# Patient Record
Sex: Male | Born: 1955
Health system: Southern US, Community
[De-identification: ages and names within clinical notes are randomized; demographics above are authoritative.]

## PROBLEM LIST (undated history)

## (undated) DIAGNOSIS — Z8739 Personal history of other diseases of the musculoskeletal system and connective tissue: Secondary | ICD-10-CM

## (undated) DIAGNOSIS — I10 Essential (primary) hypertension: Secondary | ICD-10-CM

## (undated) DIAGNOSIS — D649 Anemia, unspecified: Secondary | ICD-10-CM

## (undated) DIAGNOSIS — H9190 Unspecified hearing loss, unspecified ear: Secondary | ICD-10-CM

## (undated) DIAGNOSIS — F4310 Post-traumatic stress disorder, unspecified: Secondary | ICD-10-CM

## (undated) DIAGNOSIS — F0390 Unspecified dementia without behavioral disturbance: Secondary | ICD-10-CM

## (undated) DIAGNOSIS — R569 Unspecified convulsions: Secondary | ICD-10-CM

## (undated) HISTORY — PX: JOINT REPLACEMENT: SHX530

## (undated) HISTORY — PX: HYDROCELE EXCISION: SHX482

---

## 1998-12-19 ENCOUNTER — Emergency Department (HOSPITAL_COMMUNITY): Admission: EM | Admit: 1998-12-19 | Discharge: 1998-12-19 | Payer: Self-pay | Admitting: Emergency Medicine

## 2013-11-23 HISTORY — PX: TOTAL KNEE ARTHROPLASTY: SHX125

## 2015-07-24 HISTORY — PX: REVISION TOTAL KNEE ARTHROPLASTY: SHX767

## 2015-11-29 ENCOUNTER — Encounter (HOSPITAL_COMMUNITY): Payer: Self-pay | Admitting: Emergency Medicine

## 2015-11-29 ENCOUNTER — Observation Stay (HOSPITAL_COMMUNITY)
Admission: EM | Admit: 2015-11-29 | Discharge: 2015-11-30 | Disposition: A | Discharge status: Home / Self Care | Payer: Non-veteran care | Attending: Internal Medicine | Admitting: Internal Medicine

## 2015-11-29 ENCOUNTER — Emergency Department (HOSPITAL_COMMUNITY): Payer: Non-veteran care

## 2015-11-29 ENCOUNTER — Observation Stay (HOSPITAL_COMMUNITY): Payer: Non-veteran care

## 2015-11-29 DIAGNOSIS — R9431 Abnormal electrocardiogram [ECG] [EKG]: Secondary | ICD-10-CM

## 2015-11-29 DIAGNOSIS — R569 Unspecified convulsions: Principal | ICD-10-CM

## 2015-11-29 DIAGNOSIS — I10 Essential (primary) hypertension: Secondary | ICD-10-CM | POA: Diagnosis not present

## 2015-11-29 DIAGNOSIS — R079 Chest pain, unspecified: Secondary | ICD-10-CM | POA: Diagnosis present

## 2015-11-29 DIAGNOSIS — R778 Other specified abnormalities of plasma proteins: Secondary | ICD-10-CM

## 2015-11-29 DIAGNOSIS — I672 Cerebral atherosclerosis: Secondary | ICD-10-CM | POA: Insufficient documentation

## 2015-11-29 DIAGNOSIS — S0081XA Abrasion of other part of head, initial encounter: Secondary | ICD-10-CM | POA: Insufficient documentation

## 2015-11-29 DIAGNOSIS — R739 Hyperglycemia, unspecified: Secondary | ICD-10-CM | POA: Diagnosis not present

## 2015-11-29 DIAGNOSIS — R7989 Other specified abnormal findings of blood chemistry: Secondary | ICD-10-CM | POA: Diagnosis present

## 2015-11-29 HISTORY — DX: Unspecified convulsions: R56.9

## 2015-11-29 HISTORY — DX: Essential (primary) hypertension: I10

## 2015-11-29 HISTORY — DX: Anemia, unspecified: D64.9

## 2015-11-29 HISTORY — DX: Personal history of other diseases of the musculoskeletal system and connective tissue: Z87.39

## 2015-11-29 HISTORY — DX: Unspecified hearing loss, unspecified ear: H91.90

## 2015-11-29 HISTORY — DX: Post-traumatic stress disorder, unspecified: F43.10

## 2015-11-29 LAB — CBC
HCT: 39.7 % (ref 39.0–52.0)
HEMATOCRIT: 37.3 % — AB (ref 39.0–52.0)
HEMOGLOBIN: 13.3 g/dL (ref 13.0–17.0)
HEMOGLOBIN: 12.4 g/dL — AB (ref 13.0–17.0)
MCH: 30.6 pg (ref 26.0–34.0)
MCH: 30.6 pg (ref 26.0–34.0)
MCHC: 33.5 g/dL (ref 30.0–36.0)
MCHC: 33.2 g/dL (ref 30.0–36.0)
MCV: 91.5 fL (ref 78.0–100.0)
MCV: 92.1 fL (ref 78.0–100.0)
PLATELETS: 201 10*3/uL (ref 150–400)
Platelets: 219 10*3/uL (ref 150–400)
RBC: 4.34 MIL/uL (ref 4.22–5.81)
RBC: 4.05 MIL/uL — AB (ref 4.22–5.81)
RDW: 14 % (ref 11.5–15.5)
RDW: 14 % (ref 11.5–15.5)
WBC: 8.7 10*3/uL (ref 4.0–10.5)
WBC: 5.8 10*3/uL (ref 4.0–10.5)

## 2015-11-29 LAB — COMPREHENSIVE METABOLIC PANEL
ALBUMIN: 4.7 g/dL (ref 3.5–5.0)
ALK PHOS: 78 U/L (ref 38–126)
ALT: 25 U/L (ref 17–63)
ANION GAP: 12 (ref 5–15)
AST: 44 U/L — AB (ref 15–41)
BUN: 9 mg/dL (ref 6–20)
CALCIUM: 9.7 mg/dL (ref 8.9–10.3)
CO2: 26 mmol/L (ref 22–32)
Chloride: 99 mmol/L — ABNORMAL LOW (ref 101–111)
Creatinine, Ser: 0.91 mg/dL (ref 0.61–1.24)
GFR calc Af Amer: >60 mL/min (ref >60)
GFR calc non Af Amer: >60 mL/min (ref >60)
GLUCOSE: 137 mg/dL — AB (ref 65–99)
Potassium: 3.9 mmol/L (ref 3.5–5.1)
SODIUM: 137 mmol/L (ref 135–145)
Total Bilirubin: 1.6 mg/dL — ABNORMAL HIGH (ref 0.3–1.2)
Total Protein: 7.7 g/dL (ref 6.5–8.1)

## 2015-11-29 LAB — RAPID URINE DRUG SCREEN, HOSP PERFORMED
AMPHETAMINES: NOT DETECTED
Barbiturates: NOT DETECTED
Benzodiazepines: POSITIVE — AB
Cocaine: NOT DETECTED
Opiates: NOT DETECTED
TETRAHYDROCANNABINOL: NOT DETECTED

## 2015-11-29 LAB — CREATININE, SERUM
Creatinine, Ser: 1.01 mg/dL (ref 0.61–1.24)
GFR calc non Af Amer: >60 mL/min (ref >60)

## 2015-11-29 LAB — TROPONIN I
TROPONIN I: 0.25 ng/mL — AB (ref <0.03)
TROPONIN I: 0.4 ng/mL — AB (ref <0.03)
Troponin I: 0.35 ng/mL (ref <0.03)

## 2015-11-29 LAB — ETHANOL

## 2015-11-29 MED ORDER — MORPHINE SULFATE (PF) 2 MG/ML IV SOLN: 2.0000 mg | INTRAVENOUS | Status: DC | PRN

## 2015-11-29 MED ORDER — MORPHINE SULFATE (PF) 4 MG/ML IV SOLN
4.0000 mg | Freq: Once | INTRAVENOUS | Status: AC
  Administered 2015-11-29: 4 mg via INTRAVENOUS
  Filled 2015-11-29: qty 1

## 2015-11-29 MED ORDER — ONDANSETRON HCL 4 MG/2ML IJ SOLN: 4.0000 mg | Freq: Four times a day (QID) | INTRAMUSCULAR | Status: DC | PRN

## 2015-11-29 MED ORDER — GI COCKTAIL ~~LOC~~: 30.0000 mL | Freq: Four times a day (QID) | ORAL | Status: DC | PRN

## 2015-11-29 MED ORDER — LORAZEPAM 2 MG/ML IJ SOLN: 2.0000 mg | INTRAMUSCULAR | Status: DC | PRN

## 2015-11-29 MED ORDER — IBUPROFEN 400 MG PO TABS
400.0000 mg | ORAL_TABLET | Freq: Once | ORAL | Status: AC
  Administered 2015-11-29: 400 mg via ORAL
  Filled 2015-11-29: qty 1

## 2015-11-29 MED ORDER — ENOXAPARIN SODIUM 40 MG/0.4ML ~~LOC~~ SOLN
40.0000 mg | SUBCUTANEOUS | Status: DC
  Administered 2015-11-29: 40 mg via SUBCUTANEOUS
  Filled 2015-11-29: qty 0.4

## 2015-11-29 MED ORDER — FERROUS SULFATE 325 (65 FE) MG PO TABS
325.0000 mg | ORAL_TABLET | Freq: Every day | ORAL | Status: DC
  Administered 2015-11-30: 325 mg via ORAL
  Filled 2015-11-29: qty 1

## 2015-11-29 MED ORDER — ACETAMINOPHEN 325 MG PO TABS: 650.0000 mg | ORAL_TABLET | ORAL | Status: DC | PRN

## 2015-11-29 MED ORDER — ASPIRIN EC 325 MG PO TBEC
325.0000 mg | DELAYED_RELEASE_TABLET | Freq: Every day | ORAL | Status: DC
  Administered 2015-11-29 – 2015-11-30 (×2): 325 mg via ORAL
  Filled 2015-11-29 (×2): qty 1

## 2015-11-29 MED ORDER — HYDROCHLOROTHIAZIDE 25 MG PO TABS
12.5000 mg | ORAL_TABLET | Freq: Every day | ORAL | Status: DC
  Administered 2015-11-29 – 2015-11-30 (×2): 12.5 mg via ORAL
  Filled 2015-11-29 (×2): qty 1

## 2015-11-29 MED ORDER — NAPROXEN 375 MG PO TABS
375.0000 mg | ORAL_TABLET | Freq: Two times a day (BID) | ORAL | Status: DC | PRN
  Filled 2015-11-29: qty 1

## 2015-11-29 NOTE — ED Notes (Signed)
Pt given sprite still wants to speak to dr

## 2015-11-29 NOTE — ED Notes (Signed)
Wife in with pt and I  Explained that most of labs looked good pt requested dr Beverlee Nimssteinal to recheck on him

## 2015-11-29 NOTE — ED Notes (Signed)
Patient transported to CT 
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
## 2015-11-29 NOTE — ED Notes (Signed)
XR at bedside

## 2015-11-29 NOTE — ED Provider Notes (Signed)
5:30 PM Pt with no ongoing symtoms to suggest ACS at this time. He does have some mild left sided chest tenderness which seems more traumatic in nature. No thoracic or lumbar tenderness. No c spine tendernes.  Chest x-ray without widened mediastinum or other acute traumatic abnormalities.  Full range of motion bilateral hips.  Abdominal exam is benign.  Symptoms today sound like a seizure for which she will need ongoing workup as an outpatient.  He did have 10 minutes of left-sided chest discomfort which is now resolved and has a slightly abnormal EKG and a troponin of 0.25.  I discussed his case with Dr. Herbie BaltimoreHarding of cardiology who reviewed both EKGs and I reviewed the clinical history and my examination of the patient.  At this time he believes that no anticoagulation should be started.  He thinks troponin should be cycled.  He agrees EKG is abnormal but very nonspecific and not specific for 1 coronary vessel in particular.  The patient does have a family history of cardiac disease including several family members with cardiac stents.  Patient be admitted to the internal medicine/hospitalist service for echocardiogram, serial troponins, repeat evaluations.  At this time cardiology does not believe that they need formal consultation unless there is an abnormality on his echocardiogram or he has rising troponins, or symptoms that begin to represent typical anginal symptoms.  There recommendations are as above   Azalia BilisKevin Yerlin Gasparyan, MD 11/29/15 1733

## 2015-11-29 NOTE — ED Notes (Signed)
MD at bedside. 

## 2015-11-29 NOTE — ED Notes (Signed)
Pt informed of the need for urine sample. Pt has urinal at bedside.
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
## 2015-11-29 NOTE — ED Notes (Signed)
Wife did states they had 3 beers last night and that sz  A few years ago was from etoh
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
## 2015-11-29 NOTE — ED Notes (Signed)
Dr. Campos aware of pts troponin.  

## 2015-11-29 NOTE — ED Notes (Signed)
Attempted to call report. Floor RN unable to accept report.  

## 2015-11-30 ENCOUNTER — Observation Stay (HOSPITAL_COMMUNITY): Payer: Non-veteran care

## 2015-11-30 DIAGNOSIS — R739 Hyperglycemia, unspecified: Secondary | ICD-10-CM | POA: Diagnosis not present

## 2015-11-30 DIAGNOSIS — R079 Chest pain, unspecified: Secondary | ICD-10-CM | POA: Diagnosis not present

## 2015-11-30 DIAGNOSIS — R569 Unspecified convulsions: Secondary | ICD-10-CM

## 2015-11-30 DIAGNOSIS — I10 Essential (primary) hypertension: Secondary | ICD-10-CM | POA: Diagnosis not present

## 2015-11-30 LAB — ECHOCARDIOGRAM COMPLETE
Height: 67 in
Weight: 2561.6 oz

## 2015-11-30 LAB — TROPONIN I: Troponin I: 0.24 ng/mL (ref <0.03)

## 2015-11-30 MED ORDER — ASPIRIN EC 81 MG PO TBEC: 81.0000 mg | DELAYED_RELEASE_TABLET | Freq: Every day | ORAL | 0 refills | Status: AC

## 2015-11-30 NOTE — Care Management Note (Signed)
Case Management Note  Patient Details  Name: Matthew Cross MRN: [id number] Date of Birth: 02/17/1956  Subjective/Objective:   Seizures                 Action/Plan: Discharge Planning: AVS reviewed:  NCM spoke to pt and states [facility], pt states [place]. [place]. Has RW, cane, and bedside commode at home. Wife at home to assist with care. [facility]   PCP- Dr Debbe [place]alesBrook [place], [facility]   Expected Discharge Date:  11/30/2015             Expected Discharge Plan:  Home/Self Care  In-House Referral:  NA  Discharge planning Services  CM Consult  Post Acute Care Choice:  NA Choice offered to:  NA  DME Arranged:  N/A DME Agency:  NA  HH Arranged:  NA HH Agency:  NA  Status of Service:  Completed, signed off  If discussed at Long Length of Stay Meetings, dates discussed:    Additional Comments:  Elliot CousinShavis, Lauri Till Ellen, RN 11/30/2015, 5:58 PM

## 2015-11-30 NOTE — Progress Notes (Signed)
EEG completed, results pending. 

## 2015-11-30 NOTE — Discharge Instructions (Signed)
Do not drive, operate heavy machinery, perform activities at heights, swimming or participation in water activities or provide baby sitting services if your were admitted for syncope or siezures until you have seen a Neurologist and advised to do so again.  [facility] in 7 days   Get CBC, CMP, 2 view Chest X ray checked  by Primary MD or SNF MD in 5-7 days ( we routinely change or add medications that can affect your baseline labs and fluid status, therefore we recommend that you get the mentioned basic workup next visit with your PCP, your PCP may decide not to get them or add new tests based on their clinical decision)   Activity: As tolerated with Full fall precautions use walker/cane & assistance as needed   Disposition Home     Diet:   Heart Healthy  .  For Heart failure patients - Check your Weight same time everyday, if you gain over 2 pounds, or you develop in leg swelling, experience more shortness of breath or chest pain, call your Primary MD immediately. Follow Cardiac Low Salt Diet and 1.5 lit/day fluid restriction.   On your next visit with your primary care physician please Get Medicines reviewed and adjusted.   Please request your Prim.MD to go over all Hospital Tests and Procedure/Radiological results at the follow up, please get all Hospital records sent to your Prim MD by signing hospital release before you go home.   If you experience worsening of your admission symptoms, develop shortness of breath, life threatening emergency, suicidal or homicidal thoughts you must seek medical attention immediately by calling 911 or calling your MD immediately  if symptoms less severe.  You Must read complete instructions/literature along with all the possible adverse reactions/side effects for all the Medicines you take and that have been prescribed to you. Take any new Medicines after you have completely understood and accpet all the possible  adverse reactions/side effects.    Do not drive when taking Pain medications.    Do not take more than prescribed Pain, Sleep and Anxiety Medications  Special Instructions: If you have smoked or chewed Tobacco  in the last 2 yrs please stop smoking, stop any regular Alcohol  and or any Recreational drug use.  Wear Seat belts while driving.   Please note  You were cared for by a hospitalist during your hospital stay. If you have any questions about your discharge medications or the care you received while you were in the hospital after you are discharged, you can call the unit and asked to speak with the hospitalist on call if the hospitalist that took care of you is not available. Once you are discharged, your primary care physician will handle any further medical issues. Please note that NO REFILLS for any discharge medications will be authorized once you are discharged, as it is imperative that you return to your primary care physician (or establish a relationship with a primary care physician if you do not have one) for your aftercare needs so that they can reassess your need for medications and monitor your lab values.

## 2015-11-30 NOTE — Consult Note (Signed)
Short Note:  MRI Brain- no abnormalities seen  EEG- normal  Seizure most likely a result of sleep deprivation over the last 4/5 days  No need for AED's at this time since it was provoked from lack of sleep  Should follow up with Neurology in 2 weeks  Please call back with any questions

## 2015-11-30 NOTE — Progress Notes (Signed)
Patient is discharge to home accompanied by patient's family member and NT via wheelchair. Discharge instructions given . Patient verbalizes understanding. All personal belongings given. Telemetry box and IV removed prior to discharge and site in good condition.  
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
## 2015-11-30 NOTE — Progress Notes (Signed)
*  PRELIMINARY RESULTS* Echocardiogram 2D Echocardiogram has been performed.  Jeryl Columbialliott, Ethelyne Erich 11/30/2015, 2:41 PM
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
## 2015-12-01 LAB — HEMOGLOBIN A1C
HEMOGLOBIN A1C: 4.8 % (ref 4.8–5.6)
Mean Plasma Glucose: 91 mg/dL

## 2016-02-10 ENCOUNTER — Emergency Department (HOSPITAL_COMMUNITY)
Admission: EM | Admit: 2016-02-10 | Discharge: 2016-02-10 | Disposition: A | Discharge status: Home / Self Care | Payer: Non-veteran care | Attending: Emergency Medicine | Admitting: Emergency Medicine

## 2016-02-10 ENCOUNTER — Encounter (HOSPITAL_COMMUNITY): Payer: Self-pay | Admitting: *Deleted

## 2016-02-10 DIAGNOSIS — Z96651 Presence of right artificial knee joint: Secondary | ICD-10-CM | POA: Diagnosis not present

## 2016-02-10 DIAGNOSIS — Z7982 Long term (current) use of aspirin: Secondary | ICD-10-CM | POA: Insufficient documentation

## 2016-02-10 DIAGNOSIS — R569 Unspecified convulsions: Secondary | ICD-10-CM | POA: Diagnosis present

## 2016-02-10 DIAGNOSIS — Z79899 Other long term (current) drug therapy: Secondary | ICD-10-CM | POA: Diagnosis not present

## 2016-02-10 DIAGNOSIS — F10230 Alcohol dependence with withdrawal, uncomplicated: Secondary | ICD-10-CM

## 2016-02-10 DIAGNOSIS — F1010 Alcohol abuse, uncomplicated: Secondary | ICD-10-CM

## 2016-02-10 DIAGNOSIS — F10930 Alcohol use, unspecified with withdrawal, uncomplicated: Secondary | ICD-10-CM

## 2016-02-10 DIAGNOSIS — F10239 Alcohol dependence with withdrawal, unspecified: Secondary | ICD-10-CM | POA: Insufficient documentation

## 2016-02-10 DIAGNOSIS — I10 Essential (primary) hypertension: Secondary | ICD-10-CM | POA: Insufficient documentation

## 2016-02-10 LAB — RAPID URINE DRUG SCREEN, HOSP PERFORMED
AMPHETAMINES: NOT DETECTED
BARBITURATES: NOT DETECTED
BENZODIAZEPINES: NOT DETECTED
Cocaine: NOT DETECTED
Opiates: NOT DETECTED
TETRAHYDROCANNABINOL: NOT DETECTED

## 2016-02-10 LAB — CBC WITH DIFFERENTIAL/PLATELET
Basophils Absolute: 0 10*3/uL (ref 0.0–0.1)
Basophils Relative: 0 %
EOS PCT: 0 %
Eosinophils Absolute: 0 10*3/uL (ref 0.0–0.7)
HCT: 36.3 % — ABNORMAL LOW (ref 39.0–52.0)
Hemoglobin: 12.4 g/dL — ABNORMAL LOW (ref 13.0–17.0)
LYMPHS ABS: 0.5 10*3/uL — AB (ref 0.7–4.0)
LYMPHS PCT: 9 %
MCH: 31.4 pg (ref 26.0–34.0)
MCHC: 34.2 g/dL (ref 30.0–36.0)
MCV: 91.9 fL (ref 78.0–100.0)
MONO ABS: 0.4 10*3/uL (ref 0.1–1.0)
Monocytes Relative: 7 %
Neutro Abs: 4.5 10*3/uL (ref 1.7–7.7)
Neutrophils Relative %: 84 %
PLATELETS: 157 10*3/uL (ref 150–400)
RBC: 3.95 MIL/uL — AB (ref 4.22–5.81)
RDW: 13.5 % (ref 11.5–15.5)
WBC: 5.5 10*3/uL (ref 4.0–10.5)

## 2016-02-10 LAB — COMPREHENSIVE METABOLIC PANEL
ALT: 36 U/L (ref 17–63)
AST: 103 U/L — ABNORMAL HIGH (ref 15–41)
Albumin: 4.5 g/dL (ref 3.5–5.0)
Alkaline Phosphatase: 61 U/L (ref 38–126)
Anion gap: 15 (ref 5–15)
BUN: 6 mg/dL (ref 6–20)
CO2: 22 mmol/L (ref 22–32)
Calcium: 9.3 mg/dL (ref 8.9–10.3)
Chloride: 100 mmol/L — ABNORMAL LOW (ref 101–111)
Creatinine, Ser: 0.99 mg/dL (ref 0.61–1.24)
GFR calc Af Amer: >60 mL/min (ref >60)
GFR calc non Af Amer: >60 mL/min (ref >60)
Glucose, Bld: 184 mg/dL — ABNORMAL HIGH (ref 65–99)
Potassium: 4 mmol/L (ref 3.5–5.1)
Sodium: 137 mmol/L (ref 135–145)
Total Bilirubin: 1.4 mg/dL — ABNORMAL HIGH (ref 0.3–1.2)
Total Protein: 7.4 g/dL (ref 6.5–8.1)

## 2016-02-10 LAB — URINALYSIS, ROUTINE W REFLEX MICROSCOPIC
Bilirubin Urine: NEGATIVE
Glucose, UA: NEGATIVE mg/dL
Hgb urine dipstick: NEGATIVE
Ketones, ur: NEGATIVE mg/dL
Leukocytes, UA: NEGATIVE
Nitrite: NEGATIVE
Protein, ur: 30 mg/dL — AB
Specific Gravity, Urine: 1.012 (ref 1.005–1.030)
pH: 7.5 (ref 5.0–8.0)

## 2016-02-10 LAB — URINE MICROSCOPIC-ADD ON
Bacteria, UA: NONE SEEN
RBC / HPF: NONE SEEN RBC/hpf (ref 0–5)
SQUAMOUS EPITHELIAL / LPF: NONE SEEN
WBC, UA: NONE SEEN WBC/hpf (ref 0–5)

## 2016-02-10 LAB — ETHANOL: Alcohol, Ethyl (B): <5 mg/dL (ref <5)

## 2016-02-10 MED ORDER — FOLIC ACID 1 MG PO TABS
1.0000 mg | ORAL_TABLET | Freq: Every day | ORAL | Status: DC
  Administered 2016-02-10: 1 mg via ORAL
  Filled 2016-02-10: qty 1

## 2016-02-10 MED ORDER — CHLORDIAZEPOXIDE HCL 25 MG PO CAPS: ORAL_CAPSULE | ORAL | 0 refills | Status: DC

## 2016-02-10 MED ORDER — THIAMINE HCL 100 MG/ML IJ SOLN: 100.0000 mg | Freq: Every day | INTRAMUSCULAR | Status: DC

## 2016-02-10 MED ORDER — ONDANSETRON HCL 4 MG PO TABS: 4.0000 mg | ORAL_TABLET | Freq: Three times a day (TID) | ORAL | 0 refills | Status: DC | PRN

## 2016-02-10 MED ORDER — LORAZEPAM 2 MG/ML IJ SOLN: 0.0000 mg | Freq: Two times a day (BID) | INTRAMUSCULAR | Status: DC

## 2016-02-10 MED ORDER — SODIUM CHLORIDE 0.9 % IV BOLUS (SEPSIS)
1000.0000 mL | Freq: Once | INTRAVENOUS | Status: AC
  Administered 2016-02-10: 1000 mL via INTRAVENOUS

## 2016-02-10 MED ORDER — LORAZEPAM 2 MG/ML IJ SOLN: 1.0000 mg | Freq: Four times a day (QID) | INTRAMUSCULAR | Status: DC | PRN

## 2016-02-10 MED ORDER — ADULT MULTIVITAMIN W/MINERALS CH
1.0000 | ORAL_TABLET | Freq: Every day | ORAL | Status: DC
  Administered 2016-02-10: 1 via ORAL
  Filled 2016-02-10: qty 1

## 2016-02-10 MED ORDER — VITAMIN B-1 100 MG PO TABS
100.0000 mg | ORAL_TABLET | Freq: Every day | ORAL | Status: DC
  Administered 2016-02-10: 100 mg via ORAL
  Filled 2016-02-10: qty 1

## 2016-02-10 MED ORDER — LORAZEPAM 2 MG/ML IJ SOLN: 0.0000 mg | Freq: Four times a day (QID) | INTRAMUSCULAR | Status: DC

## 2016-02-10 MED ORDER — LORAZEPAM 2 MG/ML IJ SOLN
1.0000 mg | Freq: Once | INTRAMUSCULAR | Status: AC
  Administered 2016-02-10: 1 mg via INTRAVENOUS
  Filled 2016-02-10: qty 1

## 2016-02-10 MED ORDER — LORAZEPAM 1 MG PO TABS: 1.0000 mg | ORAL_TABLET | Freq: Four times a day (QID) | ORAL | Status: DC | PRN
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
## 2016-02-10 NOTE — ED Notes (Signed)
Placed patient into a gown and on the monitor 

## 2016-02-10 NOTE — ED Notes (Signed)
PA Emily at the bedside.  

## 2016-02-10 NOTE — Discharge Instructions (Signed)
Read the information below.  Use the prescribed medication as directed.  Please discuss all new medications with your pharmacist.  You may return to the Emergency Department at any time for worsening condition or any new symptoms that concern you.    Do not drink alcohol while taking the medications.  If you feel that you are having withdrawal symptoms from alcohol or you have another seizure, return to the Emergency Department for a recheck.

## 2016-02-10 NOTE — ED Triage Notes (Signed)
Pt and family member reports drinking wine two days and now had seizure approx 20 mins ago. Hx of withdrawal seizures.

## 2016-09-20 ENCOUNTER — Emergency Department (HOSPITAL_COMMUNITY): Payer: Non-veteran care

## 2016-09-20 ENCOUNTER — Encounter (HOSPITAL_COMMUNITY): Payer: Self-pay

## 2016-09-20 ENCOUNTER — Emergency Department (HOSPITAL_COMMUNITY)
Admission: EM | Admit: 2016-09-20 | Discharge: 2016-09-20 | Disposition: A | Discharge status: Home / Self Care | Payer: Non-veteran care | Attending: Emergency Medicine | Admitting: Emergency Medicine

## 2016-09-20 DIAGNOSIS — Z79899 Other long term (current) drug therapy: Secondary | ICD-10-CM | POA: Diagnosis not present

## 2016-09-20 DIAGNOSIS — F10930 Alcohol use, unspecified with withdrawal, uncomplicated: Secondary | ICD-10-CM

## 2016-09-20 DIAGNOSIS — F10239 Alcohol dependence with withdrawal, unspecified: Secondary | ICD-10-CM | POA: Insufficient documentation

## 2016-09-20 DIAGNOSIS — G4089 Other seizures: Secondary | ICD-10-CM | POA: Diagnosis not present

## 2016-09-20 DIAGNOSIS — I10 Essential (primary) hypertension: Secondary | ICD-10-CM | POA: Insufficient documentation

## 2016-09-20 DIAGNOSIS — Z96651 Presence of right artificial knee joint: Secondary | ICD-10-CM | POA: Diagnosis not present

## 2016-09-20 DIAGNOSIS — Z7982 Long term (current) use of aspirin: Secondary | ICD-10-CM | POA: Insufficient documentation

## 2016-09-20 DIAGNOSIS — R4182 Altered mental status, unspecified: Secondary | ICD-10-CM

## 2016-09-20 DIAGNOSIS — F10230 Alcohol dependence with withdrawal, uncomplicated: Secondary | ICD-10-CM

## 2016-09-20 DIAGNOSIS — R569 Unspecified convulsions: Secondary | ICD-10-CM

## 2016-09-20 LAB — RAPID URINE DRUG SCREEN, HOSP PERFORMED
AMPHETAMINES: NOT DETECTED
BARBITURATES: NOT DETECTED
BENZODIAZEPINES: NOT DETECTED
Cocaine: NOT DETECTED
Opiates: NOT DETECTED
TETRAHYDROCANNABINOL: NOT DETECTED

## 2016-09-20 LAB — URINALYSIS, ROUTINE W REFLEX MICROSCOPIC
Bacteria, UA: NONE SEEN
Bilirubin Urine: NEGATIVE
GLUCOSE, UA: NEGATIVE mg/dL
Ketones, ur: 5 mg/dL — AB
Leukocytes, UA: NEGATIVE
NITRITE: NEGATIVE
PH: 7 (ref 5.0–8.0)
PROTEIN: 100 mg/dL — AB
SPECIFIC GRAVITY, URINE: 1.018 (ref 1.005–1.030)

## 2016-09-20 LAB — CBC WITH DIFFERENTIAL/PLATELET
BASOS PCT: 0 %
Basophils Absolute: 0 10*3/uL (ref 0.0–0.1)
Eosinophils Absolute: 0 10*3/uL (ref 0.0–0.7)
Eosinophils Relative: 0 %
HEMATOCRIT: 42.4 % (ref 39.0–52.0)
HEMOGLOBIN: 13.8 g/dL (ref 13.0–17.0)
LYMPHS ABS: 1.1 10*3/uL (ref 0.7–4.0)
Lymphocytes Relative: 11 %
MCH: 31.2 pg (ref 26.0–34.0)
MCHC: 32.5 g/dL (ref 30.0–36.0)
MCV: 95.7 fL (ref 78.0–100.0)
MONO ABS: 0.6 10*3/uL (ref 0.1–1.0)
MONOS PCT: 6 %
NEUTROS ABS: 8.4 10*3/uL — AB (ref 1.7–7.7)
Neutrophils Relative %: 83 %
Platelets: 203 10*3/uL (ref 150–400)
RBC: 4.43 MIL/uL (ref 4.22–5.81)
RDW: 12.9 % (ref 11.5–15.5)
WBC: 10.1 10*3/uL (ref 4.0–10.5)

## 2016-09-20 LAB — HEPATIC FUNCTION PANEL
ALT: 32 U/L (ref 17–63)
AST: 87 U/L — AB (ref 15–41)
Albumin: 4.4 g/dL (ref 3.5–5.0)
Alkaline Phosphatase: 54 U/L (ref 38–126)
BILIRUBIN DIRECT: 0.3 mg/dL (ref 0.1–0.5)
BILIRUBIN INDIRECT: 0.9 mg/dL (ref 0.3–0.9)
BILIRUBIN TOTAL: 1.2 mg/dL (ref 0.3–1.2)
Total Protein: 7.6 g/dL (ref 6.5–8.1)

## 2016-09-20 LAB — BASIC METABOLIC PANEL
ANION GAP: 22 — AB (ref 5–15)
BUN: 8 mg/dL (ref 6–20)
CALCIUM: 8.6 mg/dL — AB (ref 8.9–10.3)
CHLORIDE: 101 mmol/L (ref 101–111)
CO2: 13 mmol/L — AB (ref 22–32)
Creatinine, Ser: 1.32 mg/dL — ABNORMAL HIGH (ref 0.61–1.24)
GFR calc Af Amer: >60 mL/min (ref >60)
GFR calc non Af Amer: 56 mL/min — ABNORMAL LOW (ref >60)
GLUCOSE: 180 mg/dL — AB (ref 65–99)
Potassium: 3.6 mmol/L (ref 3.5–5.1)
Sodium: 136 mmol/L (ref 135–145)

## 2016-09-20 LAB — MAGNESIUM: Magnesium: 2.3 mg/dL (ref 1.7–2.4)

## 2016-09-20 LAB — AMMONIA: AMMONIA: 157 umol/L — AB (ref 9–35)

## 2016-09-20 LAB — SALICYLATE LEVEL

## 2016-09-20 LAB — CBG MONITORING, ED: Glucose-Capillary: 189 mg/dL — ABNORMAL HIGH (ref 65–99)

## 2016-09-20 LAB — ACETAMINOPHEN LEVEL

## 2016-09-20 LAB — ETHANOL

## 2016-09-20 MED ORDER — LORAZEPAM 1 MG PO TABS
0.0000 mg | ORAL_TABLET | Freq: Four times a day (QID) | ORAL | Status: DC
Start: 2016-09-20 — End: 2016-09-20

## 2016-09-20 MED ORDER — THIAMINE HCL 100 MG/ML IJ SOLN
100.0000 mg | Freq: Once | INTRAMUSCULAR | Status: AC
  Administered 2016-09-20: 100 mg via INTRAVENOUS
  Filled 2016-09-20: qty 2

## 2016-09-20 MED ORDER — LORAZEPAM 1 MG PO TABS
0.0000 mg | ORAL_TABLET | Freq: Two times a day (BID) | ORAL | Status: DC
Start: 2016-09-22 — End: 2016-09-20

## 2016-09-20 MED ORDER — LORAZEPAM 2 MG/ML IJ SOLN: 0.0000 mg | Freq: Two times a day (BID) | INTRAMUSCULAR | Status: DC

## 2016-09-20 MED ORDER — CHLORDIAZEPOXIDE HCL 25 MG PO CAPS: ORAL_CAPSULE | ORAL | 0 refills | Status: DC

## 2016-09-20 MED ORDER — LORAZEPAM 1 MG PO TABS
1.0000 mg | ORAL_TABLET | Freq: Once | ORAL | Status: AC
  Administered 2016-09-20: 1 mg via ORAL
  Filled 2016-09-20: qty 1

## 2016-09-20 MED ORDER — LORAZEPAM 2 MG/ML IJ SOLN
0.0000 mg | Freq: Four times a day (QID) | INTRAMUSCULAR | Status: DC
  Administered 2016-09-20: 2 mg via INTRAVENOUS
  Filled 2016-09-20: qty 1

## 2016-09-20 MED ORDER — FOLIC ACID 5 MG/ML IJ SOLN
1.0000 mg | Freq: Every day | INTRAMUSCULAR | Status: DC
  Administered 2016-09-20: 1 mg via INTRAVENOUS
  Filled 2016-09-20: qty 0.2

## 2016-09-20 MED ORDER — SODIUM CHLORIDE 0.9 % IV BOLUS (SEPSIS)
1000.0000 mL | Freq: Once | INTRAVENOUS | Status: AC
  Administered 2016-09-20: 1000 mL via INTRAVENOUS

## 2016-09-20 MED FILL — CHLORDIAZEPOXIDE 25 MG CAP: 25 | 6 days supply | Qty: 18 | Fill #0

## 2016-09-20 NOTE — ED Notes (Signed)
Patient transported to X-ray 

## 2016-09-20 NOTE — ED Triage Notes (Signed)
Pt arrives EMS from home with c/o seizure this am that woke wife. Pt had hx of seizure before with medication change.
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
## 2016-09-20 NOTE — ED Notes (Signed)
CBG 189. 
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
## 2016-09-20 NOTE — ED Notes (Signed)
Pt ambulates to bathroom with steady gait. Gibbons pA at bedside and pt has no diaphoresis, no hallucination or anxiety, very small tremor noted at hands but pt denies discomfort. Denies nausea. Pt has small old 1/2 inch  wound at left thumb that was bleeding but now stopped. Bandage applied.

## 2016-09-20 NOTE — ED Notes (Signed)
Gibbons PA at bedside when pt begins active seizure involving complete body. Pt placed on NRB at 10 land fluids started with ativan given to terminate seizure. Pt sleeping after medication with intermuittent snoring respirations.

## 2016-12-22 ENCOUNTER — Emergency Department (HOSPITAL_COMMUNITY): Payer: Non-veteran care

## 2016-12-22 ENCOUNTER — Emergency Department (HOSPITAL_COMMUNITY)
Admission: EM | Admit: 2016-12-22 | Discharge: 2016-12-22 | Disposition: A | Discharge status: Home / Self Care | Payer: Non-veteran care | Attending: Emergency Medicine | Admitting: Emergency Medicine

## 2016-12-22 DIAGNOSIS — R05 Cough: Secondary | ICD-10-CM | POA: Insufficient documentation

## 2016-12-22 DIAGNOSIS — Z7982 Long term (current) use of aspirin: Secondary | ICD-10-CM | POA: Insufficient documentation

## 2016-12-22 DIAGNOSIS — Z96651 Presence of right artificial knee joint: Secondary | ICD-10-CM | POA: Insufficient documentation

## 2016-12-22 DIAGNOSIS — R410 Disorientation, unspecified: Secondary | ICD-10-CM

## 2016-12-22 DIAGNOSIS — I10 Essential (primary) hypertension: Secondary | ICD-10-CM | POA: Insufficient documentation

## 2016-12-22 DIAGNOSIS — R5383 Other fatigue: Secondary | ICD-10-CM | POA: Insufficient documentation

## 2016-12-22 DIAGNOSIS — J3489 Other specified disorders of nose and nasal sinuses: Secondary | ICD-10-CM | POA: Insufficient documentation

## 2016-12-22 LAB — URINALYSIS, COMPLETE (UACMP) WITH MICROSCOPIC
BACTERIA UA: NONE SEEN
Bilirubin Urine: NEGATIVE
GLUCOSE, UA: NEGATIVE mg/dL
Ketones, ur: 5 mg/dL — AB
LEUKOCYTES UA: NEGATIVE
NITRITE: NEGATIVE
PROTEIN: 100 mg/dL — AB
SPECIFIC GRAVITY, URINE: 1.019 (ref 1.005–1.030)
pH: 5 (ref 5.0–8.0)

## 2016-12-22 LAB — CBC WITH DIFFERENTIAL/PLATELET
Basophils Absolute: 0 10*3/uL (ref 0.0–0.1)
Basophils Relative: 0 %
EOS ABS: 0 10*3/uL (ref 0.0–0.7)
EOS PCT: 0 %
HCT: 39 % (ref 39.0–52.0)
Hemoglobin: 13.2 g/dL (ref 13.0–17.0)
LYMPHS ABS: 0.7 10*3/uL (ref 0.7–4.0)
Lymphocytes Relative: 7 %
MCH: 31.3 pg (ref 26.0–34.0)
MCHC: 33.8 g/dL (ref 30.0–36.0)
MCV: 92.4 fL (ref 78.0–100.0)
MONOS PCT: 7 %
Monocytes Absolute: 0.7 10*3/uL (ref 0.1–1.0)
Neutro Abs: 8.6 10*3/uL — ABNORMAL HIGH (ref 1.7–7.7)
Neutrophils Relative %: 86 %
Platelets: 212 10*3/uL (ref 150–400)
RBC: 4.22 MIL/uL (ref 4.22–5.81)
RDW: 14 % (ref 11.5–15.5)
WBC: 9.9 10*3/uL (ref 4.0–10.5)

## 2016-12-22 LAB — COMPREHENSIVE METABOLIC PANEL
ALT: 29 U/L (ref 17–63)
AST: 57 U/L — AB (ref 15–41)
Albumin: 4.6 g/dL (ref 3.5–5.0)
Alkaline Phosphatase: 49 U/L (ref 38–126)
Anion gap: 11 (ref 5–15)
BUN: 9 mg/dL (ref 6–20)
CHLORIDE: 99 mmol/L — AB (ref 101–111)
CO2: 24 mmol/L (ref 22–32)
CREATININE: 0.97 mg/dL (ref 0.61–1.24)
Calcium: 9 mg/dL (ref 8.9–10.3)
GFR calc Af Amer: >60 mL/min (ref >60)
GFR calc non Af Amer: >60 mL/min (ref >60)
Glucose, Bld: 118 mg/dL — ABNORMAL HIGH (ref 65–99)
Potassium: 4.1 mmol/L (ref 3.5–5.1)
SODIUM: 134 mmol/L — AB (ref 135–145)
Total Bilirubin: 1.9 mg/dL — ABNORMAL HIGH (ref 0.3–1.2)
Total Protein: 7.6 g/dL (ref 6.5–8.1)

## 2016-12-22 LAB — RAPID URINE DRUG SCREEN, HOSP PERFORMED
Amphetamines: NOT DETECTED
Barbiturates: NOT DETECTED
Benzodiazepines: POSITIVE — AB
Cocaine: NOT DETECTED
OPIATES: NOT DETECTED
Tetrahydrocannabinol: NOT DETECTED

## 2016-12-22 LAB — I-STAT CG4 LACTIC ACID, ED: LACTIC ACID, VENOUS: 1.62 mmol/L (ref 0.5–1.9)

## 2016-12-22 LAB — CBG MONITORING, ED: Glucose-Capillary: 149 mg/dL — ABNORMAL HIGH (ref 65–99)

## 2016-12-22 LAB — ETHANOL

## 2016-12-22 LAB — AMMONIA: Ammonia: 24 umol/L (ref 9–35)

## 2016-12-22 MED ORDER — SODIUM CHLORIDE 0.9 % IV SOLN
INTRAVENOUS | Status: DC
  Administered 2016-12-22: 19:00:00 via INTRAVENOUS

## 2016-12-22 MED ORDER — CHLORDIAZEPOXIDE HCL 25 MG PO CAPS: ORAL_CAPSULE | ORAL | 0 refills | Status: DC

## 2016-12-22 MED ORDER — THIAMINE HCL 100 MG/ML IJ SOLN
100.0000 mg | Freq: Once | INTRAMUSCULAR | Status: AC
  Administered 2016-12-22: 100 mg via INTRAVENOUS
  Filled 2016-12-22: qty 2

## 2016-12-22 MED ORDER — SODIUM CHLORIDE 0.9 % IV BOLUS (SEPSIS)
1000.0000 mL | Freq: Once | INTRAVENOUS | Status: AC
  Administered 2016-12-22: 1000 mL via INTRAVENOUS

## 2016-12-22 NOTE — ED Notes (Signed)
Patient returned from X-ray 

## 2016-12-22 NOTE — ED Notes (Signed)
Patient transported to CT 

## 2016-12-22 NOTE — ED Triage Notes (Signed)
Pt arrived via [facility] from home c/o witnessed seizure by wife.  EMS gave  Versed IM.  Pt states he takes medications for alcohol withdrawal. Last DT seizure 2 months ago per wife.
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
## 2016-12-22 NOTE — ED Notes (Signed)
Pt stable, ambulatory, states understanding of discharge instructions 

## 2016-12-22 NOTE — ED Notes (Signed)
Pt ambulated to bathroom without difficulty.

## 2016-12-22 NOTE — ED Notes (Signed)
Collected dark green for etoh.

## 2016-12-22 NOTE — ED Notes (Signed)
Patient transported to X-ray 
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
## 2018-11-26 ENCOUNTER — Encounter
Payer: No Typology Code available for payment source | Attending: Physical Medicine & Rehabilitation | Admitting: Physical Medicine & Rehabilitation

## 2018-11-26 ENCOUNTER — Other Ambulatory Visit: Payer: Self-pay

## 2018-11-26 ENCOUNTER — Encounter: Payer: Self-pay | Admitting: Physical Medicine & Rehabilitation

## 2018-11-26 VITALS — BP 135/83 | HR 67 | Temp 97.7°F | Ht 68.0 in | Wt 180.0 lb

## 2018-11-26 DIAGNOSIS — T8484XS Pain due to internal orthopedic prosthetic devices, implants and grafts, sequela: Secondary | ICD-10-CM | POA: Insufficient documentation

## 2018-11-26 DIAGNOSIS — G8929 Other chronic pain: Secondary | ICD-10-CM

## 2018-11-26 DIAGNOSIS — Z96651 Presence of right artificial knee joint: Secondary | ICD-10-CM | POA: Insufficient documentation

## 2018-11-26 DIAGNOSIS — T8484XA Pain due to internal orthopedic prosthetic devices, implants and grafts, initial encounter: Secondary | ICD-10-CM | POA: Insufficient documentation

## 2018-11-26 DIAGNOSIS — Z96659 Presence of unspecified artificial knee joint: Secondary | ICD-10-CM | POA: Insufficient documentation

## 2018-11-26 DIAGNOSIS — M545 Low back pain: Secondary | ICD-10-CM | POA: Insufficient documentation

## 2018-11-26 NOTE — Patient Instructions (Signed)
Will do Genicular nerve blocks next visit
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
## 2018-12-17 ENCOUNTER — Encounter: Payer: No Typology Code available for payment source | Admitting: Physical Medicine & Rehabilitation

## 2018-12-17 ENCOUNTER — Other Ambulatory Visit: Payer: Self-pay

## 2018-12-17 VITALS — Ht 68.0 in | Wt 184.0 lb

## 2018-12-17 DIAGNOSIS — T8484XS Pain due to internal orthopedic prosthetic devices, implants and grafts, sequela: Secondary | ICD-10-CM

## 2018-12-17 DIAGNOSIS — Z96659 Presence of unspecified artificial knee joint: Secondary | ICD-10-CM

## 2018-12-17 DIAGNOSIS — T8484XA Pain due to internal orthopedic prosthetic devices, implants and grafts, initial encounter: Secondary | ICD-10-CM | POA: Diagnosis not present

## 2018-12-17 NOTE — Progress Notes (Signed)
Genicular nerve block x 3, superior medial, superior lateral , and Inferior Medial under fluoroscopic guidance  Indication Chronic post operative pain in the Knee, pain postop total knee replacement which has not responded to conservative management such as physical therapy and medication management  Informed consent was obtained after describing risks and to the procedure to the patient these include bleeding bruising and infection, patient elects to proceed and has given written consent. Patient placed supine on the fluoroscopy table AP images of the knee joint were obtained. A 27-gauge 1.5 inch needle was used to anesthetize the skin and subcutaneous tissue with 1% lidocaine, 1 cc at each of 3 locations. Then a 22-gauge 3.5" spinal needle was inserted targeting the junction of the medial flare of the tibia with the shaft of the tibia, bone contact made and confirmed with lateral imaging. Then Omnipaque 180 x0.5 mL demonstrated no intravascular uptake followed by injection of 1.45ml .5% bupivacaine. Then the junction of the medial epicondyles of the femur with the femoral shaft was targeted needle was advanced under fluoroscopic guidance until bone contact. Appropriate depth was obtained and confirmed with lateral images. Then Isovue 200 x0.5 mL demonstrated no intravascular uptake followed by injection of 1.63ml of .5% bupivacaine. Then the junction of the lateral femoral condyle with the femoral shaft was targeted. 22-gauge 3.5 inch needle was advanced under fluoroscopic guidance until bone contact. Appropriate depth was confirmed with lateral imaging. 0.5 mL of Isovue 200 injected followed by injection of 1.5 cc of .5% bupivacaine solution. Patient tolerated procedure well. Post procedure instructions given  Pre injection pain 10/10 Post injection pain 4/10

## 2018-12-17 NOTE — Progress Notes (Signed)
  PROCEDURE RECORD [facility]   Name: Matthew Cross DOB:March 04, 1956 MRN: [id number]  Date:12/17/2018  Physician: Alysia Penna, MD    Nurse/CMA: Truman Hayward, CMA  Allergies:  Allergies  Allergen Reactions  . Shellfish Allergy Other (See Comments)    Causes gout flares  . Zoloft [Sertraline] Other (See Comments)    Makes patient "feel like a zombie"/INDUCED A SEIZURE!!  . Grapefruit Extract Swelling  . Grapefruit Bioflavonoid Complex Rash  . Lisinopril Other (See Comments)    intolerance    Consent Signed: Yes.    Is patient diabetic? No.  CBG today?   Pregnant: No. LMP: No LMP for male patient. (age 33-55)  Anticoagulants: no Anti-inflammatory: yes (naproxen) Antibiotics: no  Procedure: Right Genicular Nerve Block Position: Prone Start Time: 1:57pm  End Time: 2:11pm Fluoro Time: 38   RN/CMA Ardyn Forge,CMA Cary Lothrop,CMA    Time 1:43pm 2:18pm    BP 144/80 150/83    Pulse 65 61    Respirations 16 16    O2 Sat 96 97    S/S 6 6    Pain Level 10/10 4/10     D/C home with wife, patient A & O X 3, D/C instructions reviewed, and sits independently.

## 2018-12-17 NOTE — Patient Instructions (Signed)
You had Right Genicular nerve blocks for chronic post operative knee pain.  If this gives you short term relief, then a radiofrequency procedure of these same nerves would likely give you a more prolonged relief

## 2019-01-13 ENCOUNTER — Encounter: Payer: No Typology Code available for payment source | Admitting: Physical Medicine & Rehabilitation

## 2019-01-21 ENCOUNTER — Other Ambulatory Visit: Payer: Self-pay

## 2019-01-21 ENCOUNTER — Encounter: Payer: Self-pay | Admitting: Physical Medicine & Rehabilitation

## 2019-01-21 ENCOUNTER — Encounter
Payer: No Typology Code available for payment source | Attending: Physical Medicine & Rehabilitation | Admitting: Physical Medicine & Rehabilitation

## 2019-01-21 VITALS — BP 141/75 | HR 56 | Resp 14 | Ht 67.0 in | Wt 190.0 lb

## 2019-01-21 DIAGNOSIS — T8484XA Pain due to internal orthopedic prosthetic devices, implants and grafts, initial encounter: Secondary | ICD-10-CM | POA: Insufficient documentation

## 2019-01-21 DIAGNOSIS — G8929 Other chronic pain: Secondary | ICD-10-CM | POA: Diagnosis present

## 2019-01-21 DIAGNOSIS — M545 Low back pain: Secondary | ICD-10-CM | POA: Insufficient documentation

## 2019-01-21 DIAGNOSIS — T8484XS Pain due to internal orthopedic prosthetic devices, implants and grafts, sequela: Secondary | ICD-10-CM | POA: Insufficient documentation

## 2019-01-21 DIAGNOSIS — Z96651 Presence of right artificial knee joint: Secondary | ICD-10-CM | POA: Insufficient documentation

## 2019-01-21 DIAGNOSIS — Z96659 Presence of unspecified artificial knee joint: Secondary | ICD-10-CM

## 2019-01-21 NOTE — Progress Notes (Signed)
  PROCEDURE RECORD [facility]   Name: Connar Keating DOB:July 23, 1955 MRN: [id number]  Date:01/21/2019  Physician: Alysia Penna, MD    Nurse/CMA: Gayathri Futrell, CMA  Allergies:  Allergies  Allergen Reactions  . Shellfish Allergy Other (See Comments)    Causes gout flares  . Zoloft [Sertraline] Other (See Comments)    Makes patient "feel like a zombie"/INDUCED A SEIZURE!!  . Grapefruit Extract Swelling  . Grapefruit Bioflavonoid Complex Rash  . Lisinopril Other (See Comments)    intolerance    Consent Signed: Yes.    Is patient diabetic? No.  CBG today?   Pregnant: No. LMP: No LMP for male patient. (age 10-55)  Anticoagulants: no Anti-inflammatory: no Antibiotics: no  Procedure: right genicular radiofrequency  Position: Prone Start Time: 12:04pm  End Time:  12:37pm  Fluoro Time: 54s  RN/CMA Moreen Piggott, CMA Jimma Ortman, CMA    Time 11:35am 12:45pm    BP 141/75 171/80    Pulse 56 70    Respirations 14 14    O2 Sat 96 97    S/S 6 6    Pain Level 8/10 5/10     D/C home with wife, patient A & O X 3, D/C instructions reviewed, and sits independently.

## 2019-01-21 NOTE — Patient Instructions (Signed)
You had a radio frequency procedure today °This was done to alleviate joint pain in your Right kneearea °We injected lidocaine which is a local anesthetic. ° °You may experience soreness at the injection sites. °You may also experienced some irritation of the nerves that were heated °I'm recommending ice for 30 minutes every 2 hours as needed for the next 24-48 hours °  °

## 2019-01-21 NOTE — Progress Notes (Addendum)
Right  genicular nerve COOLIEF radiofrequency neurotomy under fluoroscopic guidance  Indication : Chronic severe Right  knee pain that has not responded to PT, medication, and other conservative care.THere has been 50% relief of usual knee pain with each of 2 sets of genicular nerve blocks under fluoroscopic guidance  Informed consent was obtained after discussing risks and benefits of procedure with the patient.  These include bleeding, bruising and infection as well as foot numbness. Pt place in supine position on the fluoro table .  Static images identified distal femur and proximal tibia.  Lateral and medial supracondylar , as well as medial tibial flare prepped with betadine.  Marked under fluoro and then a 25 g 1.5 inch needle was used to anesthetize skin and subcutaneous tissue. 25ml 1% lidocaine  was infiltrated into each of 3 sites. Then a 17-gauge 5cm RF needle with a 31mm active  tip  was inserted under fluoroscopic guidance first targeting the Right lateral supracondylar flare midpoint. After bone contact was made lateral images confirmed proper positioning at midpoint of shaft ,then Motor stim at 2 Hz demonstrated no motor twitch followed by injection of 4ml of 2% lidocaine and radiofrequency lesioning 80 deg for 150 sec. . This same procedure was treated for the  medial supracondylar and medial tibial flare areas using same equipment and technique. Patient tolerated procedure well. Post procedure instructions given.

## 2019-04-16 ENCOUNTER — Encounter
Payer: No Typology Code available for payment source | Attending: Physical Medicine & Rehabilitation | Admitting: Physical Medicine & Rehabilitation

## 2019-04-16 ENCOUNTER — Encounter: Payer: Self-pay | Admitting: Physical Medicine & Rehabilitation

## 2019-04-16 ENCOUNTER — Other Ambulatory Visit: Payer: Self-pay

## 2019-04-16 VITALS — BP 151/77 | HR 63 | Temp 97.5°F | Ht 67.0 in | Wt 184.0 lb

## 2019-04-16 DIAGNOSIS — Z96659 Presence of unspecified artificial knee joint: Secondary | ICD-10-CM | POA: Insufficient documentation

## 2019-04-16 DIAGNOSIS — T8484XS Pain due to internal orthopedic prosthetic devices, implants and grafts, sequela: Secondary | ICD-10-CM | POA: Insufficient documentation

## 2019-04-16 NOTE — Progress Notes (Signed)
Subjective:    Patient ID: Matthew Cross, male    DOB: 1955/11/24, 64 y.o.   MRN: [id number]  HPI   Improvement in right knee pain is like "day and night" since RF Right knee  Back pain slightly better since R Knee RF  Going to PT at [facility] in [place]  No longer needs Naproxen, no other pain medications at this time  He remains independent with all self-care and mobility.  He does use a cane for ambulation.   Pain Inventory Average Pain 7 Pain Right Now 8 My pain is sharp  In the last 24 hours, has pain interfered with the following? General activity 5 Relation with others 5 Enjoyment of life 7 What TIME of day is your pain at its worst? daytime Sleep (in general) Fair  Pain is worse with: walking, bending, standing and some activites Pain improves with: heat/ice and injections Relief from Meds: .  Mobility walk with assistance how many minutes can you walk? 10  Function not employed: date last employed . disabled: date disabled . retired  Neuro/Psych anxiety  Prior Studies Any changes since last visit?  no  Physicians involved in your care Any changes since last visit?  no   Family History  Problem Relation Age of Onset  . Heart attack Father   . Heart attack Brother   . Coronary artery disease Brother   . Diabetes Brother   . Hypertension Brother    Social History   Socioeconomic History  . Marital status: Married    Spouse name: Not on file  . Number of children: Not on file  . Years of education: Not on file  . Highest education level: Not on file  Occupational History  . Not on file  Tobacco Use  . Smoking status: Never Smoker  . Smokeless tobacco: Never Used  Substance and Sexual Activity  . Alcohol use: Yes    Comment: 11/29/2015 "nothing for the last couple months; did drink ~ 3 glasses of wine/night prior to that"  . Drug use: No  . Sexual activity: Yes  Other Topics Concern  . Not on file  Social History Narrative   Married, lives  with wife. Disabled. Normally independent of ADLs and with ambulation.   Social Determinants of Health   Financial Resource Strain:   . Difficulty of Paying Living Expenses: Not on file  Food Insecurity:   . Worried About Charity fundraiser in the Last Year: Not on file  . Ran Out of Food in the Last Year: Not on file  Transportation Needs:   . Lack of Transportation (Medical): Not on file  . Lack of Transportation (Non-Medical): Not on file  Physical Activity:   . Days of Exercise per Week: Not on file  . Minutes of Exercise per Session: Not on file  Stress:   . Feeling of Stress : Not on file  Social Connections:   . Frequency of Communication with Friends and Family: Not on file  . Frequency of Social Gatherings with Friends and Family: Not on file  . Attends Religious Services: Not on file  . Active Member of Clubs or Organizations: Not on file  . Attends Archivist Meetings: Not on file  . Marital Status: Not on file   Past Surgical History:  Procedure Laterality Date  . HYDROCELE EXCISION Right ~ 2010  . JOINT REPLACEMENT    . REVISION TOTAL KNEE ARTHROPLASTY Right 07/2015  . TOTAL KNEE ARTHROPLASTY Right 11/2013  Past Medical History:  Diagnosis Date  . Anemia   . Hard of hearing   . History of gout   . Hypertension   . PTSD (post-traumatic stress disorder)   . Seizures (HCC) 2014; 11/29/2015   "don't know why" (11/29/2015)   BP (!) 151/77   Pulse 63   Temp (!) 97.5 F (36.4 C)   Ht 5\' 7"  (1.702 m)   Wt 184 lb (83.5 kg)   SpO2 96%   BMI 28.82 kg/m   Opioid Risk Score:   Fall Risk Score:  `1  Depression screen PHQ 2/9  Depression screen PHQ 2/9 11/26/2018  Decreased Interest 0  Down, Depressed, Hopeless 0  PHQ - 2 Score 0    Review of Systems  Constitutional: Negative.   HENT: Negative.   Eyes: Negative.   Respiratory: Negative.   Cardiovascular: Negative.   Gastrointestinal: Negative.   Endocrine: Negative.   Genitourinary: Negative.    Musculoskeletal: Positive for arthralgias.  Skin: Negative.   Allergic/Immunologic: Negative.   Neurological: Negative.   Hematological: Negative.   Psychiatric/Behavioral: The patient is nervous/anxious.   All other systems reviewed and are negative.      Objective:   Physical Exam Vitals and nursing note reviewed.  Constitutional:      Appearance: Normal appearance.  HENT:     Head: Normocephalic and atraumatic.  Eyes:     Extraocular Movements: Extraocular movements intact.     Conjunctiva/sclera: Conjunctivae normal.     Pupils: Pupils are equal, round, and reactive to light.  Musculoskeletal:     Comments: Right knee has full range of motion with flexion extension.  No evidence of knee effusion.  Mild tenderness over the lateral joint line.  Neurological:     General: No focal deficit present.     Mental Status: He is alert and oriented to person, place, and time.     Sensory: No sensory deficit.     Motor: No weakness.     Comments: Motor strength 5/5 bilateral deltoid bicep tricep grip hip flexion extension, ankle dorsiflexor  Psychiatric:        Mood and Affect: Mood normal.        Behavior: Behavior normal.        Thought Content: Thought content normal.        Judgment: Judgment normal.           Assessment & Plan:  #1.  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  He has done very well with genicular nerve blocks as well as the genicular nerve radiofrequency neurotomy which was performed on 01/21/2019.  His only residual pain is in the region that is not lesion.  This is the inferior lateral aspect because the fibular nerve could potentially be affected.  He is no longer taking nonsteroidal anti-inflammatories which is great. We discussed that is difficult to predict the duration of response but we would like to get at least 6 months of relief from this procedure.  He is 3 months post now and I will see him in another 3 months for a follow-up appointment. He  is to call if knee pain has returned to a severe level prior to that time so we can set up a repeat right genicular nerve cool leaf radiofrequency neurotomy under fluoroscopic guidance not before May 2021

## 2019-04-16 NOTE — Patient Instructions (Signed)
Please call if pain gets worse in R Knee

## 2019-05-11 ENCOUNTER — Other Ambulatory Visit: Payer: Self-pay

## 2019-05-11 DIAGNOSIS — Z20822 Contact with and (suspected) exposure to covid-19: Secondary | ICD-10-CM

## 2019-05-12 LAB — NOVEL CORONAVIRUS, NAA: SARS-CoV-2, NAA: NOT DETECTED

## 2019-05-14 ENCOUNTER — Telehealth: Payer: Self-pay | Admitting: *Deleted

## 2019-05-14 NOTE — Telephone Encounter (Signed)
Patient called given negative covid results . 

## 2019-07-15 ENCOUNTER — Ambulatory Visit: Payer: No Typology Code available for payment source | Admitting: Physical Medicine & Rehabilitation

## 2019-07-27 ENCOUNTER — Encounter
Payer: No Typology Code available for payment source | Attending: Physical Medicine & Rehabilitation | Admitting: Physical Medicine & Rehabilitation

## 2019-07-27 ENCOUNTER — Other Ambulatory Visit: Payer: Self-pay

## 2019-07-27 ENCOUNTER — Ambulatory Visit: Payer: No Typology Code available for payment source | Admitting: Physical Medicine & Rehabilitation

## 2019-07-27 ENCOUNTER — Encounter: Payer: Self-pay | Admitting: Physical Medicine & Rehabilitation

## 2019-07-27 VITALS — BP 133/79 | HR 62 | Temp 98.7°F | Ht 67.0 in | Wt 172.2 lb

## 2019-07-27 DIAGNOSIS — Z96659 Presence of unspecified artificial knee joint: Secondary | ICD-10-CM | POA: Insufficient documentation

## 2019-07-27 DIAGNOSIS — T8484XS Pain due to internal orthopedic prosthetic devices, implants and grafts, sequela: Secondary | ICD-10-CM | POA: Insufficient documentation

## 2019-07-27 NOTE — Progress Notes (Signed)
Subjective:    Patient ID: Matthew Cross, male    DOB: [birth year], 64 y.o.   MRN: [id number]  HPI  Patient is nearly 6 months post right knee genicular nerve coolief RF which was performed on 01/21/2019. Finished with PT, was strengthening and stretching right lower extremity Also has mild chronic low back pain but this has not increased. Right knee- doing well with mild/moderate pain  Doing 1.5 -2 mi walks   No pain pills since RF  Pain Inventory Average Pain 5 Pain Right Now 5 My pain is intermittent, burning, stabbing and aching  In the last 24 hours, has pain interfered with the following? General activity 4 Relation with others 2 Enjoyment of life 4 What TIME of day is your pain at its worst? evening Sleep (in general) Fair  Pain is worse with: walking and bending Pain improves with: rest and heat/ice Relief from Meds: na  Mobility use a cane ability to climb steps?  yes do you drive?  yes  Function not employed: date last employed .  Neuro/Psych trouble walking  Prior Studies Any changes since last visit?  no  Physicians involved in your care Any changes since last visit?  no   Family History  Problem Relation Age of Onset  . Heart attack Father   . Heart attack Brother   . Coronary artery disease Brother   . Diabetes Brother   . Hypertension Brother    Social History   Socioeconomic History  . Marital status: Married    Spouse name: Not on file  . Number of children: Not on file  . Years of education: Not on file  . Highest education level: Not on file  Occupational History  . Not on file  Tobacco Use  . Smoking status: Never Smoker  . Smokeless tobacco: Never Used  Substance and Sexual Activity  . Alcohol use: Yes    Comment: 11/29/2015 "nothing for the last couple months; did drink ~ 3 glasses of wine/night prior to that"  . Drug use: No  . Sexual activity: Yes  Other Topics Concern  . Not on file  Social History Narrative   Married, lives with wife. Disabled. Normally independent of ADLs and with ambulation.   Social Determinants of Health   Financial Resource Strain:   . Difficulty of Paying Living Expenses:   Food Insecurity:   . Worried About Charity fundraiser in the Last Year:   . Arboriculturist in the Last Year:   Transportation Needs:   . Film/video editor (Medical):   Marland Kitchen Lack of Transportation (Non-Medical):   Physical Activity:   . Days of Exercise per Week:   . Minutes of Exercise per Session:   Stress:   . Feeling of Stress :   Social Connections:   . Frequency of Communication with Friends and Family:   . Frequency of Social Gatherings with Friends and Family:   . Attends Religious Services:   . Active Member of Clubs or Organizations:   . Attends Archivist Meetings:   Marland Kitchen Marital Status:    Past Surgical History:  Procedure Laterality Date  . HYDROCELE EXCISION Right ~ 2010  . JOINT REPLACEMENT    . REVISION TOTAL KNEE ARTHROPLASTY Right 07/2015  . TOTAL KNEE ARTHROPLASTY Right 11/2013   Past Medical History:  Diagnosis Date  . Anemia   . Hard of hearing   . History of gout   . Hypertension   . PTSD (  post-traumatic stress disorder)   . Seizures (HCC) 2014; 11/29/2015   "don't know why" (11/29/2015)   BP 133/79   Pulse 62   Temp 98.7 F (37.1 C)   Ht 5\' 7"  (1.702 m)   Wt 172 lb 3.2 oz (78.1 kg)   SpO2 98%   BMI 26.97 kg/m   Opioid Risk Score:   Fall Risk Score:  `1  Depression screen PHQ 2/9  Depression screen PHQ 2/9 11/26/2018  Decreased Interest 0  Down, Depressed, Hopeless 0  PHQ - 2 Score 0     Review of Systems  Constitutional: Positive for unexpected weight change.  Musculoskeletal: Positive for gait problem.  All other systems reviewed and are negative.      Objective:   Physical Exam Vitals and nursing note reviewed.  Constitutional:      Appearance: Normal appearance.  HENT:     Head: Normocephalic and atraumatic.  Eyes:      Extraocular Movements: Extraocular movements intact.     Conjunctiva/sclera: Conjunctivae normal.     Pupils: Pupils are equal, round, and reactive to light.  Musculoskeletal:     Comments: Right knee without effusion Healed TKR incision Minimal tenderness inferior lateral aspect of the knee in incision no open areas No pain with right knee range of motion Right knee range of motion within functional limits Gait without evidence of toe drag or knee instability does not require assistive device  Skin:    General: Skin is warm and dry.  Neurological:     Mental Status: He is alert and oriented to person, place, and time. Mental status is at baseline.     Comments: Normal strength right hip flexor knee extensor ankle dorsiflexor  Psychiatric:        Mood and Affect: Mood normal.        Behavior: Behavior normal.           Assessment & Plan:  #1.  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  At this point the RF is still effective.  We discussed that it is difficult to predict duration of effect.  Once he feels like his knee pain is steadily increasing he may call to reschedule.  I will see him back as needed

## 2019-07-27 NOTE — Patient Instructions (Signed)
Please call if/when Right knee pain starts to worsen we can repeat Right knee Coolief RF

## 2022-04-30 ENCOUNTER — Other Ambulatory Visit: Payer: Self-pay

## 2022-04-30 ENCOUNTER — Observation Stay (HOSPITAL_COMMUNITY): Payer: No Typology Code available for payment source

## 2022-04-30 ENCOUNTER — Emergency Department (HOSPITAL_COMMUNITY): Payer: No Typology Code available for payment source

## 2022-04-30 ENCOUNTER — Observation Stay (HOSPITAL_COMMUNITY)
Admission: EM | Admit: 2022-04-30 | Discharge: 2022-05-02 | Disposition: A | Discharge status: Home / Self Care | Payer: No Typology Code available for payment source | Attending: Internal Medicine | Admitting: Internal Medicine

## 2022-04-30 ENCOUNTER — Encounter (HOSPITAL_COMMUNITY): Payer: Self-pay

## 2022-04-30 DIAGNOSIS — Z79899 Other long term (current) drug therapy: Secondary | ICD-10-CM | POA: Diagnosis not present

## 2022-04-30 DIAGNOSIS — G40919 Epilepsy, unspecified, intractable, without status epilepticus: Secondary | ICD-10-CM | POA: Diagnosis not present

## 2022-04-30 DIAGNOSIS — F05 Delirium due to known physiological condition: Secondary | ICD-10-CM | POA: Diagnosis not present

## 2022-04-30 DIAGNOSIS — Z8673 Personal history of transient ischemic attack (TIA), and cerebral infarction without residual deficits: Secondary | ICD-10-CM

## 2022-04-30 DIAGNOSIS — Z96651 Presence of right artificial knee joint: Secondary | ICD-10-CM | POA: Insufficient documentation

## 2022-04-30 DIAGNOSIS — Z7982 Long term (current) use of aspirin: Secondary | ICD-10-CM | POA: Diagnosis not present

## 2022-04-30 DIAGNOSIS — E785 Hyperlipidemia, unspecified: Secondary | ICD-10-CM | POA: Insufficient documentation

## 2022-04-30 DIAGNOSIS — E876 Hypokalemia: Secondary | ICD-10-CM | POA: Insufficient documentation

## 2022-04-30 DIAGNOSIS — E871 Hypo-osmolality and hyponatremia: Secondary | ICD-10-CM

## 2022-04-30 DIAGNOSIS — R569 Unspecified convulsions: Principal | ICD-10-CM | POA: Insufficient documentation

## 2022-04-30 DIAGNOSIS — R4182 Altered mental status, unspecified: Secondary | ICD-10-CM

## 2022-04-30 DIAGNOSIS — G40909 Epilepsy, unspecified, not intractable, without status epilepticus: Secondary | ICD-10-CM

## 2022-04-30 DIAGNOSIS — I10 Essential (primary) hypertension: Secondary | ICD-10-CM | POA: Insufficient documentation

## 2022-04-30 LAB — CBC
HCT: 40.5 % (ref 39.0–52.0)
Hemoglobin: 13 g/dL (ref 13.0–17.0)
MCH: 28 pg (ref 26.0–34.0)
MCHC: 32.1 g/dL (ref 30.0–36.0)
MCV: 87.3 fL (ref 80.0–100.0)
Platelets: 214 10*3/uL (ref 150–400)
RBC: 4.64 MIL/uL (ref 4.22–5.81)
RDW: 11.9 % (ref 11.5–15.5)
WBC: 3.3 10*3/uL — ABNORMAL LOW (ref 4.0–10.5)
nRBC: 0 % (ref 0.0–0.2)

## 2022-04-30 LAB — RAPID URINE DRUG SCREEN, HOSP PERFORMED
Amphetamines: NOT DETECTED
Barbiturates: NOT DETECTED
Benzodiazepines: POSITIVE — AB
Cocaine: NOT DETECTED
Opiates: NOT DETECTED
Tetrahydrocannabinol: NOT DETECTED

## 2022-04-30 LAB — COMPREHENSIVE METABOLIC PANEL
ALT: 18 U/L (ref 0–44)
AST: 24 U/L (ref 15–41)
Albumin: 4.1 g/dL (ref 3.5–5.0)
Alkaline Phosphatase: 41 U/L (ref 38–126)
Anion gap: 12 (ref 5–15)
BUN: 7 mg/dL — ABNORMAL LOW (ref 8–23)
CO2: 25 mmol/L (ref 22–32)
Calcium: 8.9 mg/dL (ref 8.9–10.3)
Chloride: 94 mmol/L — ABNORMAL LOW (ref 98–111)
Creatinine, Ser: 0.96 mg/dL (ref 0.61–1.24)
GFR, Estimated: >60 mL/min (ref >60)
Glucose, Bld: 105 mg/dL — ABNORMAL HIGH (ref 70–99)
Potassium: 3.9 mmol/L (ref 3.5–5.1)
Sodium: 131 mmol/L — ABNORMAL LOW (ref 135–145)
Total Bilirubin: 0.8 mg/dL (ref 0.3–1.2)
Total Protein: 6.7 g/dL (ref 6.5–8.1)

## 2022-04-30 LAB — CBG MONITORING, ED: Glucose-Capillary: 130 mg/dL — ABNORMAL HIGH (ref 70–99)

## 2022-04-30 LAB — MAGNESIUM: Magnesium: 1.9 mg/dL (ref 1.7–2.4)

## 2022-04-30 LAB — ETHANOL: Alcohol, Ethyl (B): <10 mg/dL (ref <10)

## 2022-04-30 MED ORDER — PRAVASTATIN SODIUM 10 MG PO TABS
5.0000 mg | ORAL_TABLET | Freq: Every day | ORAL | Status: DC
  Administered 2022-05-01 – 2022-05-02 (×2): 5 mg via ORAL
  Filled 2022-04-30 (×2): qty 1

## 2022-04-30 MED ORDER — LORAZEPAM 2 MG/ML PO CONC: 1.0000 mg | ORAL | Status: DC | PRN

## 2022-04-30 MED ORDER — AMLODIPINE BESYLATE 10 MG PO TABS
10.0000 mg | ORAL_TABLET | Freq: Every day | ORAL | Status: DC
  Administered 2022-05-01 – 2022-05-02 (×2): 10 mg via ORAL
  Filled 2022-04-30: qty 2
  Filled 2022-04-30: qty 1

## 2022-04-30 MED ORDER — SODIUM CHLORIDE 0.9 % IV SOLN
75.0000 mL/h | INTRAVENOUS | Status: DC
  Administered 2022-04-30 – 2022-05-01 (×3): 75 mL/h via INTRAVENOUS

## 2022-04-30 MED ORDER — FERROUS SULFATE 325 (65 FE) MG PO TABS
325.0000 mg | ORAL_TABLET | ORAL | Status: DC
  Administered 2022-05-01: 325 mg via ORAL
  Filled 2022-04-30: qty 1

## 2022-04-30 MED ORDER — SODIUM CHLORIDE 0.9 % IV BOLUS
1000.0000 mL | Freq: Once | INTRAVENOUS | Status: AC
  Administered 2022-04-30: 1000 mL via INTRAVENOUS

## 2022-04-30 MED ORDER — LORAZEPAM 2 MG/ML IJ SOLN
1.0000 mg | INTRAMUSCULAR | Status: DC | PRN
  Administered 2022-05-01: 1 mg via INTRAVENOUS
  Filled 2022-04-30: qty 1

## 2022-04-30 MED ORDER — ASPIRIN 81 MG PO TBEC
81.0000 mg | DELAYED_RELEASE_TABLET | Freq: Every day | ORAL | Status: DC
  Administered 2022-05-01 – 2022-05-02 (×2): 81 mg via ORAL
  Filled 2022-04-30 (×2): qty 1

## 2022-04-30 MED ORDER — TRAZODONE HCL 50 MG PO TABS
25.0000 mg | ORAL_TABLET | Freq: Every evening | ORAL | Status: DC | PRN
  Filled 2022-04-30: qty 1

## 2022-04-30 MED ORDER — MAGNESIUM HYDROXIDE 400 MG/5ML PO SUSP: 30.0000 mL | Freq: Every day | ORAL | Status: DC | PRN

## 2022-04-30 MED ORDER — ONDANSETRON HCL 4 MG PO TABS: 4.0000 mg | ORAL_TABLET | Freq: Four times a day (QID) | ORAL | Status: DC | PRN

## 2022-04-30 MED ORDER — ACETAMINOPHEN 650 MG RE SUPP: 650.0000 mg | RECTAL | Status: DC | PRN

## 2022-04-30 MED ORDER — SODIUM CHLORIDE 0.9 % IV SOLN: INTRAVENOUS | Status: DC

## 2022-04-30 MED ORDER — ONDANSETRON HCL 4 MG/2ML IJ SOLN: 4.0000 mg | Freq: Four times a day (QID) | INTRAMUSCULAR | Status: DC | PRN

## 2022-04-30 MED ORDER — ACETAMINOPHEN 325 MG PO TABS: 650.0000 mg | ORAL_TABLET | ORAL | Status: DC | PRN

## 2022-04-30 NOTE — Assessment & Plan Note (Addendum)
-   We will continue his Norvasc and hold off HCTZ.

## 2022-04-30 NOTE — ED Notes (Signed)
Patient transported to CT 

## 2022-04-30 NOTE — H&P (Signed)
[place]   PATIENT NAME: Matthew Cross    MR#:  [id number]  DATE OF BIRTH:  04-22-1955  DATE OF ADMISSION:  04/30/2022  PRIMARY CARE PHYSICIAN: [facility]   Patient is coming from: Home  REQUESTING/REFERRING PHYSICIAN: Dorie Rank, MD  CHIEF COMPLAINT:   Chief Complaint  Patient presents with   Seizure-like activity    HISTORY OF PRESENT ILLNESS:  Matthew Cross is a 67 y.o. African-American male with medical history significant for Essential Hypertension, Alcohol-Related Seizure Disorder, PTSD, and anemia, who presented to the ER with acute onset of seizure followed by postictal phase.  His wife describes tonic clonic seizure with associated urinary incontinence.  She took off his dentures as he was biting his lip.  He admitted to dizziness without vertigo or tinnitus or headache.  No paresthesias or focal muscle weakness.  No chest pain or palpitations.  No dyspnea or cough or wheezing.  No dysuria, oliguria or hematuria or flank pain.  No bleeding diathesis.  His last alcoholic drink was 5 years ago.  He has a history of alcohol related seizures for which she was admitted here in 2017.  ED Course: When he came to the ER, vital signs were within normal.  Labs revealed hyponatremia and hypochloremia, with otherwise unremarkable CMP.  CBC showed mild leukopenia of 3.3.  Urine drug screen was positive for benzodiazepines.  Alcohol level was less than 10.  Imaging: Noncontrast head CT scan revealed no acute intracranial abnormalities.  It showed mild chronic ischemic changes in the white matter.  A brain MRI without contrast revealed age-related cerebral atrophy with chronic microvascular ischemic disease with no acute intracranial abnormalities.  It was optimal study due to motion artifact.  The patient was given 1 L bolus of IV normal saline followed by 125 mL/h.  He will be admitted to a medical telemetry observation bed for further evaluation and management. PAST  MEDICAL HISTORY:   Past Medical History:  Diagnosis Date   Anemia    Hard of hearing    History of gout    Hypertension    PTSD (post-traumatic stress disorder)    Seizures ([place]) 2014; 11/29/2015   "don't know why" (11/29/2015)    PAST SURGICAL HISTORY:   Past Surgical History:  Procedure Laterality Date   HYDROCELE EXCISION Right ~ 2010   JOINT REPLACEMENT     REVISION TOTAL KNEE ARTHROPLASTY Right 07/2015   TOTAL KNEE ARTHROPLASTY Right 11/2013    SOCIAL HISTORY:   Social History   Tobacco Use   Smoking status: Never   Smokeless tobacco: Never  Substance Use Topics   Alcohol use: Yes    Comment: 11/29/2015 "nothing for the last couple months; did drink ~ 3 glasses of wine/night prior to that"    FAMILY HISTORY:   Family History  Problem Relation Age of Onset   Heart attack Father    Heart attack Brother    Coronary artery disease Brother    Diabetes Brother    Hypertension Brother     DRUG ALLERGIES:   Allergies  Allergen Reactions   Shellfish Allergy Other (See Comments)    Causes gout flares     Zoloft [Sertraline] Other (See Comments)    Makes patient "feel like a zombie"/INDUCED A SEIZURE!!   Atorvastatin     Unknown reaction per [facility]   Grapefruit Extract Swelling   Grapefruit Bioflavonoid Complex Rash   Lisinopril Cough    REVIEW OF SYSTEMS:   ROS  As per history of present illness. All pertinent systems were reviewed above. Constitutional, HEENT, cardiovascular, respiratory, GI, GU, musculoskeletal, neuro, psychiatric, endocrine, integumentary and hematologic systems were reviewed and are otherwise negative/unremarkable except for positive findings mentioned above in the HPI.   MEDICATIONS AT HOME:   Prior to Admission medications   Medication Sig Start Date End Date Taking? Authorizing Provider  amLODipine (NORVASC) 10 MG tablet Take 10 mg by mouth daily.   Yes [provider]  aspirin EC 81 MG tablet Take 1 tablet (81 mg total) by  mouth daily. 11/30/15  Yes Leroy Sea, MD  ferrous sulfate 325 (65 FE) MG tablet Take 325 mg by mouth every other day.   Yes [provider]  hydrochlorothiazide (HYDRODIURIL) 25 MG tablet Take 12.5 mg by mouth daily.   Yes [provider]  pravastatin (PRAVACHOL) 10 MG tablet Take 5 mg by mouth daily. 10/11/21  Yes [provider]      VITAL SIGNS:  Blood pressure 135/71, pulse (!) 59, temperature 97.6 F (36.4 C), temperature source Oral, resp. rate 18, height 5\' 8"  (1.727 m), weight 77.1 kg, SpO2 100 %.  PHYSICAL EXAMINATION:  Physical Exam  GENERAL:  67 y.o.-year-old African-American male patient lying in the bed with no acute distress.  EYES: Pupils equal, round, reactive to light and accommodation. No scleral icterus. Extraocular muscles intact.  HEENT: Head atraumatic, normocephalic. Oropharynx and nasopharynx clear.  NECK:  Supple, no jugular venous distention. No thyroid enlargement, no tenderness.  LUNGS: Normal breath sounds bilaterally, no wheezing, rales,rhonchi or crepitation. No use of accessory muscles of respiration.  CARDIOVASCULAR: Regular rate and rhythm, S1, S2 normal. No murmurs, rubs, or gallops.  ABDOMEN: Soft, nondistended, nontender. Bowel sounds present. No organomegaly or mass.  EXTREMITIES: No pedal edema, cyanosis, or clubbing.  NEUROLOGIC: Cranial nerves II through XII are intact. Muscle strength 5/5 in all extremities. Sensation intact. Gait not checked.  PSYCHIATRIC: The patient is alert and oriented x 2 to place and person but not to time.  Normal affect and good eye contact. SKIN: No obvious rash, lesion, or ulcer.   LABORATORY PANEL:   CBC Recent Labs  Lab 04/30/22 1620  WBC 3.3*  HGB 13.0  HCT 40.5  PLT 214   ------------------------------------------------------------------------------------------------------------------  Chemistries  Recent Labs  Lab 04/30/22 1620  NA 131*  K 3.9  CL 94*  CO2 25   GLUCOSE 105*  BUN 7*  CREATININE 0.96  CALCIUM 8.9  MG 1.9  AST 24  ALT 18  ALKPHOS 41  BILITOT 0.8   ------------------------------------------------------------------------------------------------------------------  Cardiac Enzymes No results for input(s): "TROPONINI" in the last 168 hours. ------------------------------------------------------------------------------------------------------------------  RADIOLOGY:  MR BRAIN WO CONTRAST  Result Date: 04/30/2022 CLINICAL DATA:  Initial evaluation for mental status change. EXAM: MRI HEAD WITHOUT CONTRAST TECHNIQUE: Multiplanar, multiecho pulse sequences of the brain and surrounding structures were obtained without intravenous contrast. COMPARISON:  CT from earlier the same day. FINDINGS: Brain: Examination technically limited as the patient was unable to tolerate the full length of the exam. Additionally, provided images are severely degraded by motion artifact. Age-related cerebral atrophy with chronic microvascular ischemic disease. No evidence for acute or subacute ischemic infarct. Gray-white matter differentiation grossly maintained. No visible areas of chronic cortical infarction. No visible mass lesion, mass effect or midline shift. No hydrocephalus or extra-axial fluid collection. Vascular: Major intracranial vascular flow voids are grossly maintained at the skull base. Skull and upper cervical spine: Craniocervical junction grossly normal. Bone marrow signal  intensity grossly within normal limits. No scalp soft tissue abnormality. Sinuses/Orbits: Globes orbital soft tissues grossly within normal limits. Right maxillary sinus retention cyst noted. No significant mastoid effusion. Other: None. IMPRESSION: 1. Technically limited exam due to the patient's inability to tolerate the full length of the study as well as severe motion artifact. 2. No definite acute intracranial abnormality. 3. Age-related cerebral atrophy with chronic  microvascular ischemic disease. Electronically Signed   By: Jeannine Boga M.D.   On: 04/30/2022 23:09   CT Head Wo Contrast  Result Date: 04/30/2022 CLINICAL DATA:  Seizure EXAM: CT HEAD WITHOUT CONTRAST TECHNIQUE: Contiguous axial images were obtained from the base of the skull through the vertex without intravenous contrast. RADIATION DOSE REDUCTION: This exam was performed according to the departmental dose-optimization program which includes automated exposure control, adjustment of the mA and/or kV according to patient size and/or use of iterative reconstruction technique. COMPARISON:  CT 12/22/2016 FINDINGS: Brain: No acute territorial infarction, hemorrhage or intracranial mass. Patchy white matter hypodensity consistent with chronic small vessel ischemic change. Stable ventricle size Vascular: No hyperdense vessels. Vertebral and carotid vascular calcification. Skull: Normal. Negative for fracture or focal lesion. Sinuses/Orbits: No acute finding. Other: None IMPRESSION: 1. No CT evidence for acute intracranial abnormality. 2. Mild chronic small vessel ischemic changes of the white matter. Electronically Signed   By: Donavan Foil M.D.   On: 04/30/2022 17:45      IMPRESSION AND PLAN:  Assessment and Plan: * Breakthrough seizure ([place]) - He has associated prolonged postictal phase with persistent disorientation to time. - He will be admitted to an observation medical telemetry bed. - EEG will be obtained. - May be placed on as needed IV Ativan. - He has not been on antiseizure medications since 2017. - A neurology consult will be obtained. - Dr. Curly Shores was notified about the patient.  Hyponatremia - This is likely hypovolemic. - We will hold off HCTZ. - Will be hydrated with IV normal saline. - We will follow BMP.  History of CVA (cerebrovascular accident) - We will continue his aspirin and statin therapy.  Dyslipidemia - We will continue statin therapy.  Essential  hypertension - We will continue his Norvasc and hold off HCTZ.   DVT prophylaxis: Lovenox.  Advanced Care Planning:  Code Status: full code.  Family Communication:  The plan of care was discussed in details with the patient (and family). I answered all questions. The patient agreed to proceed with the above mentioned plan. Further management will depend upon hospital course. Disposition Plan: Back to previous home environment Consults called: none.  All the records are reviewed and case discussed with ED provider.  Status is: Observation  I certify that at the time of admission, it is my clinical judgment that the patient will require hospital care extending less than 2 midnights.                            Dispo: The patient is from: Home              Anticipated d/c is to: Home              Patient currently is not medically stable to d/c.              Difficult to place patient: No  Christel Mormon M.D on 04/30/2022 at 11:39 PM  [facility]   From 7 PM-7 AM, contact night-coverage [email or web address]  CC:  Primary care physician; [facility]

## 2022-04-30 NOTE — Assessment & Plan Note (Signed)
-   This is likely hypovolemic. - We will hold off HCTZ. - Will be hydrated with IV normal saline. - We will follow BMP.

## 2022-04-30 NOTE — ED Triage Notes (Signed)
Pt arrived to ED via [facility] w/ c/o seizure like activity. Pt had 1 witnessed sz. His wife was unable to provide much information d/t being hysterical per EMS. Pt has had 1 previous sz which was in 2017. Pt is not on any sz medications. Upon arrival to scene pt was altered and flailing around. 5mg  IM versed given. IV 18g L AC. VS: 102/68, HR 64, CBG 106, 98-100% on 2L, RR 15. Pt appears obtunded upon arrival to ED.

## 2022-04-30 NOTE — Assessment & Plan Note (Signed)
-   We will continue his aspirin and statin therapy.

## 2022-04-30 NOTE — Assessment & Plan Note (Signed)
-   We will continue statin therapy. 

## 2022-04-30 NOTE — ED Provider Notes (Signed)
[facility] Provider Note   CSN: [id number] Arrival date & time: 04/30/22  1601     History {Add pertinent medical, surgical, social history, OB history to HPI:1} Chief Complaint  Patient presents with   Seizure-like activity    Matthew Cross is a 67 y.o. male.  HPI   Patient has a history of hypertension, anemia, seizures, PTSD.  Medical records reveal hospitalization for seizures back in 2017.   Patient reportedly had negative MRIs in the past.  He presents to the ED today for an acute seizure.  T EMS report indicates the patient had a witnessed seizure by his wife.  The wife was very upset so she was not able to elaborate much.  When EMS arrived the patient was flailing around.  He was given 5 mg of Versed IM.  In the ED patient is very somnolent and is not able to provide any history  Home Medications Prior to Admission medications   Medication Sig Start Date End Date Taking? Authorizing Provider  amLODipine (NORVASC) 10 MG tablet Take 10 mg by mouth daily.    [provider]  aspirin EC 81 MG tablet Take 1 tablet (81 mg total) by mouth daily. 11/30/15   Thurnell Lose, MD  chlordiazePOXIDE (LIBRIUM) 25 MG capsule 50mg  PO TID x 1D, then 25-50mg  PO BID X 1D, then 25-50mg  PO QD X 1D 12/22/16   Cardama, Grayce Sessions, MD  ferrous sulfate 325 (65 FE) MG tablet Take 325 mg by mouth daily with breakfast.    [provider]  hydrochlorothiazide (HYDRODIURIL) 25 MG tablet Take 12.5 mg by mouth daily.    [provider]  naproxen (NAPROSYN) 375 MG tablet Take 375 mg by mouth 2 (two) times daily as needed (for knee pain).    [provider]  ondansetron (ZOFRAN) 4 MG tablet Take 1 tablet (4 mg total) by mouth every 8 (eight) hours as needed for nausea or vomiting. 02/10/16   Clayton Bibles, PA-C  Thiamine HCl (VITAMIN B-1 PO) Take 1 tablet by mouth every other day.    [provider]      Allergies     Shellfish allergy, Zoloft [sertraline], Grapefruit extract, Grapefruit bioflavonoid complex, and Lisinopril    Review of Systems   Review of Systems  Physical Exam Updated Vital Signs BP 112/68 (BP Location: Right Arm)   Pulse 64   Temp 97.7 F (36.5 C) (Axillary)   Resp 13   SpO2 98%  Physical Exam Vitals and nursing note reviewed.  Constitutional:      General: He is not in acute distress.    Appearance: He is well-developed.  HENT:     Head: Normocephalic and atraumatic.     Right Ear: External ear normal.     Left Ear: External ear normal.  Eyes:     General: No scleral icterus.       Right eye: No discharge.        Left eye: No discharge.     Conjunctiva/sclera: Conjunctivae normal.  Neck:     Trachea: No tracheal deviation.  Cardiovascular:     Rate and Rhythm: Normal rate and regular rhythm.  Pulmonary:     Effort: Pulmonary effort is normal. No respiratory distress.     Breath sounds: Normal breath sounds. No stridor. No wheezing or rales.  Abdominal:     General: Bowel sounds are normal. There is no distension.     Palpations: Abdomen is  soft.     Tenderness: There is no abdominal tenderness. There is no guarding or rebound.  Musculoskeletal:        General: No tenderness or deformity.     Cervical back: Neck supple.  Skin:    General: Skin is warm and dry.     Findings: No rash.  Neurological:     General: No focal deficit present.     Mental Status: He is lethargic.     Cranial Nerves: No dysarthria or facial asymmetry.     Sensory: No sensory deficit.     Motor: No abnormal muscle tone or seizure activity.     Coordination: Coordination normal.     Comments: Patient not following commands, opens eyes to sternal rub  Psychiatric:        Mood and Affect: Mood normal.     ED Results / Procedures / Treatments   Labs (all labs ordered are listed, but only abnormal results are displayed) Labs Reviewed  CBG MONITORING, ED - Abnormal; Notable for the  following components:      Result Value   Glucose-Capillary 130 (*)    All other components within normal limits  BASIC METABOLIC PANEL  CBC  COMPREHENSIVE METABOLIC PANEL  MAGNESIUM  ETHANOL  RAPID URINE DRUG SCREEN, HOSP PERFORMED    EKG None  Radiology No results found.  Procedures Procedures  {Document cardiac monitor, telemetry assessment procedure when appropriate:1}  Medications Ordered in ED Medications  sodium chloride 0.9 % bolus 1,000 mL (has no administration in time range)    And  0.9 %  sodium chloride infusion (has no administration in time range)    ED Course/ Medical Decision Making/ A&P   {   Click here for ABCD2, HEART and other calculatorsREFRESH Note before signing :1}                          Medical Decision Making Amount and/or Complexity of Data Reviewed Labs: ordered. Radiology: ordered.  Risk Prescription drug management.   ***  {Document critical care time when appropriate:1} {Document review of labs and clinical decision tools ie heart score, Chads2Vasc2 etc:1}  {Document your independent review of radiology images, and any outside records:1} {Document your discussion with family members, caretakers, and with consultants:1} {Document social determinants of health affecting pt's care:1} {Document your decision making why or why not admission, treatments were needed:1} Final Clinical Impression(s) / ED Diagnoses Final diagnoses:  None    Rx / DC Orders ED Discharge Orders     None

## 2022-04-30 NOTE — Assessment & Plan Note (Signed)
-   He has associated prolonged postictal phase with persistent disorientation to time. - He will be admitted to an observation medical telemetry bed. - EEG will be obtained. - May be placed on as needed IV Ativan. - He has not been on antiseizure medications since 2017. - A neurology consult will be obtained. - Dr. Curly Shores was notified about the patient.

## 2022-04-30 NOTE — ED Notes (Signed)
Pt was unable to complete the MRI-

## 2022-05-01 ENCOUNTER — Observation Stay (HOSPITAL_COMMUNITY): Payer: No Typology Code available for payment source

## 2022-05-01 DIAGNOSIS — G40919 Epilepsy, unspecified, intractable, without status epilepticus: Secondary | ICD-10-CM | POA: Diagnosis not present

## 2022-05-01 DIAGNOSIS — R569 Unspecified convulsions: Secondary | ICD-10-CM

## 2022-05-01 DIAGNOSIS — E871 Hypo-osmolality and hyponatremia: Secondary | ICD-10-CM | POA: Diagnosis not present

## 2022-05-01 LAB — MRSA NEXT GEN BY PCR, NASAL: MRSA by PCR Next Gen: NOT DETECTED

## 2022-05-01 MED ORDER — LEVETIRACETAM 500 MG PO TABS
500.0000 mg | ORAL_TABLET | Freq: Two times a day (BID) | ORAL | Status: DC
  Administered 2022-05-01 – 2022-05-02 (×2): 500 mg via ORAL
  Filled 2022-05-01 (×2): qty 1

## 2022-05-01 MED ORDER — ORAL CARE MOUTH RINSE: 15.0000 mL | OROMUCOSAL | Status: DC | PRN

## 2022-05-01 NOTE — Evaluation (Signed)
Physical Therapy Evaluation Patient Details Name: Matthew Cross MRN: [id number] DOB: 1955-12-27 Today's Date: 05/01/2022  History of Present Illness  67 y.o. male presents to [facility] hospital on 04/30/2022 after seizure. PMH includes anemia, gout, HTN, PTSD, seizures, cognitive impairment.  Clinical Impression  Pt presents to PT with deficits in cognition per daughters report, however he is mobilizing independently at this time. Pt does not demonstrate any PT needs at this time. OT order placed to further assess cognition and ability to perform ADLs. Pt's spouse is available 24/7 to assist/supervise at the time of discharge. PT recommends discharge home when medically appropriate.       Recommendations for follow up therapy are one component of a multi-disciplinary discharge planning process, led by the attending physician.  Recommendations may be updated based on patient status, additional functional criteria and insurance authorization.  Follow Up Recommendations No PT follow up      Assistance Recommended at Discharge Frequent or constant Supervision/Assistance (frequent supervision due to cognitive deficits)  Patient can return home with the following  A little help with bathing/dressing/bathroom;Assistance with cooking/housework;Direct supervision/assist for medications management;Direct supervision/assist for financial management;Assist for transportation    Equipment Recommendations None recommended by PT  Recommendations for Other Services       Functional Status Assessment Patient has had a recent decline in their functional status and demonstrates the ability to make significant improvements in function in a reasonable and predictable amount of time. (cognitive devcline, mobility appears to be baseline)     Precautions / Restrictions Precautions Precautions: Other (comment) (seizure) Restrictions Weight Bearing Restrictions: No      Mobility  Bed Mobility Overal bed mobility:  Independent                  Transfers Overall transfer level: Independent                      Ambulation/Gait Ambulation/Gait assistance: Modified independent (Device/Increase time) Gait Distance (Feet): 300 Feet Assistive device: None Gait Pattern/deviations: Step-through pattern Gait velocity: functional Gait velocity interpretation: 1.31 - 2.62 ft/sec, indicative of limited community ambulator   General Gait Details: steady step-through gait, pt voluntarily turning head left and right, distracted by other hospital rooms  Stairs            Wheelchair Mobility    Modified Rankin (Stroke Patients Only)       Balance Overall balance assessment: Needs assistance Sitting-balance support: No upper extremity supported, Feet supported Sitting balance-Leahy Scale: Good     Standing balance support: No upper extremity supported, During functional activity Standing balance-Leahy Scale: Good Standing balance comment: pt retrieves object from floor without difficulty                             Pertinent Vitals/Pain Pain Assessment Pain Assessment: No/denies pain    Home Living Family/patient expects to be discharged to:: Private residence Living Arrangements: Spouse/significant other Available Help at Discharge: Family;Available 24 hours/day Type of Home: Apartment Home Access: Level entry       Home Layout: One level Home Equipment: Cane - single point      Prior Function Prior Level of Function : Independent/Modified Independent             Mobility Comments: PRN use of cane due to knee pain ADLs Comments: spouse drives, assist with IADLs     Hand Dominance        Extremity/Trunk  Assessment   Upper Extremity Assessment Upper Extremity Assessment: Overall WFL for tasks assessed    Lower Extremity Assessment Lower Extremity Assessment: Overall WFL for tasks assessed    Cervical / Trunk Assessment Cervical / Trunk  Assessment: Normal  Communication   Communication: Expressive difficulties  Cognition Arousal/Alertness: Awake/alert Behavior During Therapy: Impulsive Overall Cognitive Status: History of cognitive impairments - at baseline                                 General Comments: pt's daughter reports cognition is impaired further than baseline. Pt often perseverating on needing leather pants and jacket during session, hard to redirect. Pt reports his daughter lives with him during session although she lives in [place]. Short term memory is impaired.        General Comments General comments (skin integrity, edema, etc.): VSS on RA    Exercises     Assessment/Plan    PT Assessment Patient does not need any further PT services  PT Problem List         PT Treatment Interventions      PT Goals (Current goals can be found in the Care Plan section)       Frequency       Co-evaluation               AM-PAC PT "6 Clicks" Mobility  Outcome Measure Help needed turning from your back to your side while in a flat bed without using bedrails?: None Help needed moving from lying on your back to sitting on the side of a flat bed without using bedrails?: None Help needed moving to and from a bed to a chair (including a wheelchair)?: None Help needed standing up from a chair using your arms (e.g., wheelchair or bedside chair)?: None Help needed to walk in hospital room?: None Help needed climbing 3-5 steps with a railing? : None 6 Click Score: 24    End of Session   Activity Tolerance: Patient tolerated treatment well Patient left: in bed;with call bell/phone within reach;with family/visitor present Nurse Communication: Mobility status PT Visit Diagnosis: Other symptoms and signs involving the nervous system (R29.898)    Time: 3662-9476 PT Time Calculation (min) (ACUTE ONLY): 17 min   Charges:   PT Evaluation $PT Eval Low Complexity: [place],  PT, DPT Acute Rehabilitation Office [phone]   Zenaida Niece 05/01/2022, 9:05 AM

## 2022-05-01 NOTE — Progress Notes (Signed)
EEG complete - results pending 

## 2022-05-01 NOTE — Procedures (Signed)
Patient Name: Matthew Cross  MRN: [id number]  Epilepsy Attending: Lora Havens  Referring Physician/Provider: Christel Mormon, MD  Date: 04/30/2022 Duration: 21.14 mins  Patient history: 67yo M with breakthrough seizure. EEG to evaluate for seizure.  Level of alertness: Awake  AEDs during EEG study: LEV  Technical aspects: This EEG study was done with scalp electrodes positioned according to the 10-20 International system of electrode placement. Electrical activity was reviewed with band pass filter of 1-70Hz , sensitivity of 7 uV/mm, display speed of 52mm/sec with a 60Hz  notched filter applied as appropriate. EEG data were recorded continuously and digitally stored.  Video monitoring was available and reviewed as appropriate.  Description: The posterior dominant rhythm consists of 9-10 Hz activity of moderate voltage (25-35 uV) seen predominantly in posterior head regions, symmetric and reactive to eye opening and eye closing. Physiologic photic driving was seen during photic stimulation.  Hyperventilation was not performed.     IMPRESSION: This study is within normal limits. No seizures or epileptiform discharges were seen throughout the recording.  A normal interictal EEG does not exclude the diagnosis of epilepsy.  Neilson Oehlert Barbra Sarks

## 2022-05-01 NOTE — Progress Notes (Signed)
SLP Cancellation Note  Patient Details Name: Matthew Cross MRN: [id number] DOB: 1955/06/21   Cancelled treatment:       Reason Eval/Treat Not Completed: SLP screened, no needs identified, will sign off Per RN and notes, patient passed swallow screen and is tolerating diet well. SLP will sign off. Please re-consult if needed.   Orange Hilligoss MA, CCC-SLP   Matthew Cross 05/01/2022, 10:04 AM

## 2022-05-01 NOTE — ED Notes (Signed)
ED TO INPATIENT HANDOFF REPORT  ED Nurse Name and Phone #: [phone]  S Name/Age/Gender Matthew Cross 67 y.o. male Room/Bed: 037C/037C  Code Status   Code Status: Prior  Home/SNF/Other Home Patient oriented to: self, place, time, and situation Is this baseline? Yes   Triage Complete: Triage complete  Chief Complaint Breakthrough seizure [facility]) [G40.919]  Triage Note Pt arrived to ED via [facility] w/ c/o seizure like activity. Pt had 1 witnessed sz. His wife was unable to provide much information d/t being hysterical per EMS. Pt has had 1 previous sz which was in 2017. Pt is not on any sz medications. Upon arrival to scene pt was altered and flailing around. 5mg  IM versed given. IV 18g L AC. VS: 102/68, HR 64, CBG 106, 98-100% on 2L, RR 15. Pt appears obtunded upon arrival to ED.   Allergies Allergies  Allergen Reactions   Shellfish Allergy Other (See Comments)    Causes gout flares     Zoloft [Sertraline] Other (See Comments)    Makes patient "feel like a zombie"/INDUCED A SEIZURE!!   Atorvastatin     Unknown reaction per [facility]   Grapefruit Extract Swelling   Grapefruit Bioflavonoid Complex Rash   Lisinopril Cough    Level of Care/Admitting Diagnosis ED Disposition     ED Disposition  Admit   Condition  --   Comment  Hospital Area: [facility] [[id number]]  Level of Care: Telemetry Medical [104]  May place patient in observation at [facility] or [facility] if equivalent level of care is available:: No  Covid Evaluation: Asymptomatic - no recent exposure (last 10 days) testing not required  Diagnosis: Breakthrough seizure [facility]) [facility]  Admitting Physician: [[id number]] Hannah Beat  Attending Physician: [[id number]] Hannah Beat          B Medical/Surgery History Past Medical History:  Diagnosis Date   Anemia    Hard of hearing    History of gout    Hypertension    PTSD (post-traumatic stress disorder)    Seizures (HCC) 2014; 11/29/2015    "don't know why" (11/29/2015)   Past Surgical History:  Procedure Laterality Date   HYDROCELE EXCISION Right ~ 2010   JOINT REPLACEMENT     REVISION TOTAL KNEE ARTHROPLASTY Right 07/2015   TOTAL KNEE ARTHROPLASTY Right 11/2013     A IV Location/Drains/Wounds Patient Lines/Drains/Airways Status     Active Line/Drains/Airways     Name Placement date Placement time Site Days   Peripheral IV 04/30/22 18 G Left Antecubital 04/30/22  1602  Antecubital  1   Peripheral IV 04/30/22 18 G 1" Right Antecubital 04/30/22  1623  Antecubital  1            Intake/Output Last 24 hours  Intake/Output Summary (Last 24 hours) at 05/01/2022 06/30/2022 Last data filed at 04/30/2022 2341 Gross per 24 hour  Intake 1344.59 ml  Output --  Net 1344.59 ml    Labs/Imaging Results for orders placed or performed during the hospital encounter of 04/30/22 (from the past 48 hour(s))  CBG monitoring, ED     Status: Abnormal   Collection Time: 04/30/22  4:10 PM  Result Value Ref Range   Glucose-Capillary 130 (H) 70 - 99 mg/dL    Comment: Glucose reference range applies only to samples taken after fasting for at least 8 hours.  CBC - if new onset seizures     Status: Abnormal   Collection Time: 04/30/22  4:20 PM  Result Value  Ref Range   WBC 3.3 (L) 4.0 - 10.5 K/uL   RBC 4.64 4.22 - 5.81 MIL/uL   Hemoglobin 13.0 13.0 - 17.0 g/dL   HCT 40.5 39.0 - 52.0 %   MCV 87.3 80.0 - 100.0 fL   MCH 28.0 26.0 - 34.0 pg   MCHC 32.1 30.0 - 36.0 g/dL   RDW 11.9 11.5 - 15.5 %   Platelets 214 150 - 400 K/uL   nRBC 0.0 0.0 - 0.2 %    Comment: Performed at [facility], [place] [address] [place], [place] [address]  Magnesium     Status: None   Collection Time: 04/30/22  4:20 PM  Result Value Ref Range   Magnesium 1.9 1.7 - 2.4 mg/dL    Comment: Performed at [facility] [address] [place], [place] [address]  Comprehensive metabolic panel     Status: Abnormal   Collection Time: 04/30/22  4:20 PM  Result  Value Ref Range   Sodium 131 (L) 135 - 145 mmol/L   Potassium 3.9 3.5 - 5.1 mmol/L   Chloride 94 (L) 98 - 111 mmol/L   CO2 25 22 - 32 mmol/L   Glucose, Bld 105 (H) 70 - 99 mg/dL    Comment: Glucose reference range applies only to samples taken after fasting for at least 8 hours.   BUN 7 (L) 8 - 23 mg/dL   Creatinine, Ser 0.96 0.61 - 1.24 mg/dL   Calcium 8.9 8.9 - 10.3 mg/dL   Total Protein 6.7 6.5 - 8.1 g/dL   Albumin 4.1 3.5 - 5.0 g/dL   AST 24 15 - 41 U/L   ALT 18 0 - 44 U/L   Alkaline Phosphatase 41 38 - 126 U/L   Total Bilirubin 0.8 0.3 - 1.2 mg/dL   GFR, Estimated >60 >60 mL/min    Comment: (NOTE) Calculated using the CKD-EPI Creatinine Equation (2021)    Anion gap 12 5 - 15    Comment: Performed at [facility] [address] [place], [place] [address]  Ethanol     Status: None   Collection Time: 04/30/22  4:43 PM  Result Value Ref Range   Alcohol, Ethyl (B) <10 <10 mg/dL    Comment: (NOTE) Lowest detectable limit for serum alcohol is 10 mg/dL.  For medical purposes only. Performed at [facility], [place] [address], [place], [place] [address]   Rapid urine drug screen (hospital performed)     Status: Abnormal   Collection Time: 04/30/22  8:15 PM  Result Value Ref Range   Opiates NONE DETECTED NONE DETECTED   Cocaine NONE DETECTED NONE DETECTED   Benzodiazepines POSITIVE (A) NONE DETECTED   Amphetamines NONE DETECTED NONE DETECTED   Tetrahydrocannabinol NONE DETECTED NONE DETECTED   Barbiturates NONE DETECTED NONE DETECTED    Comment: (NOTE) DRUG SCREEN FOR MEDICAL PURPOSES ONLY.  IF CONFIRMATION IS NEEDED FOR ANY PURPOSE, NOTIFY LAB WITHIN 5 DAYS.  LOWEST DETECTABLE LIMITS FOR URINE DRUG SCREEN Drug Class                     Cutoff (ng/mL) Amphetamine and metabolites    1000 Barbiturate and metabolites    200 Benzodiazepine                 200 Opiates and metabolites        300 Cocaine and metabolites        300 THC  50 Performed at [facility], [place] [address], [place], [place] [address]    EEG adult  Result Date: 05/01/2022 Lora Havens, MD     05/01/2022  9:06 AM Patient Name: Matthew Cross MRN: [id number] Epilepsy Attending: Lora Havens Referring Physician/Provider: Christel Mormon, MD Date: 04/30/2022 Duration: 21.14 mins Patient history: 67yo M with breakthrough seizure. EEG to evaluate for seizure. Level of alertness: Awake AEDs during EEG study: LEV Technical aspects: This EEG study was done with scalp electrodes positioned according to the 10-20 International system of electrode placement. Electrical activity was reviewed with band pass filter of 1-70Hz , sensitivity of 7 uV/mm, display speed of 60mm/sec with a 60Hz  notched filter applied as appropriate. EEG data were recorded continuously and digitally stored.  Video monitoring was available and reviewed as appropriate. Description: The posterior dominant rhythm consists of 9-10 Hz activity of moderate voltage (25-35 uV) seen predominantly in posterior head regions, symmetric and reactive to eye opening and eye closing. Physiologic photic driving was seen during photic stimulation.  Hyperventilation was not performed.   IMPRESSION: This study is within normal limits. No seizures or epileptiform discharges were seen throughout the recording. A normal interictal EEG does not exclude the diagnosis of epilepsy. Lora Havens   MR BRAIN WO CONTRAST  Result Date: 04/30/2022 CLINICAL DATA:  Initial evaluation for mental status change. EXAM: MRI HEAD WITHOUT CONTRAST TECHNIQUE: Multiplanar, multiecho pulse sequences of the brain and surrounding structures were obtained without intravenous contrast. COMPARISON:  CT from earlier the same day. FINDINGS: Brain: Examination technically limited as the patient was unable to tolerate the full length of the exam. Additionally, provided images are severely degraded by motion artifact. Age-related cerebral atrophy with  chronic microvascular ischemic disease. No evidence for acute or subacute ischemic infarct. Gray-white matter differentiation grossly maintained. No visible areas of chronic cortical infarction. No visible mass lesion, mass effect or midline shift. No hydrocephalus or extra-axial fluid collection. Vascular: Major intracranial vascular flow voids are grossly maintained at the skull base. Skull and upper cervical spine: Craniocervical junction grossly normal. Bone marrow signal intensity grossly within normal limits. No scalp soft tissue abnormality. Sinuses/Orbits: Globes orbital soft tissues grossly within normal limits. Right maxillary sinus retention cyst noted. No significant mastoid effusion. Other: None. IMPRESSION: 1. Technically limited exam due to the patient's inability to tolerate the full length of the study as well as severe motion artifact. 2. No definite acute intracranial abnormality. 3. Age-related cerebral atrophy with chronic microvascular ischemic disease. Electronically Signed   By: Jeannine Boga M.D.   On: 04/30/2022 23:09   CT Head Wo Contrast  Result Date: 04/30/2022 CLINICAL DATA:  Seizure EXAM: CT HEAD WITHOUT CONTRAST TECHNIQUE: Contiguous axial images were obtained from the base of the skull through the vertex without intravenous contrast. RADIATION DOSE REDUCTION: This exam was performed according to the departmental dose-optimization program which includes automated exposure control, adjustment of the mA and/or kV according to patient size and/or use of iterative reconstruction technique. COMPARISON:  CT 12/22/2016 FINDINGS: Brain: No acute territorial infarction, hemorrhage or intracranial mass. Patchy white matter hypodensity consistent with chronic small vessel ischemic change. Stable ventricle size Vascular: No hyperdense vessels. Vertebral and carotid vascular calcification. Skull: Normal. Negative for fracture or focal lesion. Sinuses/Orbits: No acute finding. Other: None  IMPRESSION: 1. No CT evidence for acute intracranial abnormality. 2. Mild chronic small vessel ischemic changes of the white matter. Electronically Signed   By: Donavan Foil M.D.   On:  04/30/2022 17:45    Pending Labs Unresulted Labs (From admission, onward)     Start     Ordered   05/02/22 1660  Basic metabolic panel  Tomorrow morning,   R        05/01/22 0945   05/02/22 0500  Magnesium  Tomorrow morning,   R        05/01/22 0945            Vitals/Pain Today's Vitals   05/01/22 0230 05/01/22 0400 05/01/22 0600 05/01/22 0918  BP:  131/73 128/71 129/69  Pulse: (!) 58 63 (!) 58 64  Resp: 15 18 14 18   Temp:    98.7 F (37.1 C)  TempSrc:    Oral  SpO2: 99% 100% 100% 100%  Weight:      Height:      PainSc:    0-No pain    Isolation Precautions No active isolations  Medications Medications  sodium chloride 0.9 % bolus 1,000 mL (0 mLs Intravenous Stopped 04/30/22 1719)    And  0.9 %  sodium chloride infusion (0 mLs Intravenous Stopped 04/30/22 2341)  0.9 %  sodium chloride infusion (75 mL/hr Intravenous New Bag/Given 05/01/22 0829)  acetaminophen (TYLENOL) tablet 650 mg (has no administration in time range)    Or  acetaminophen (TYLENOL) suppository 650 mg (has no administration in time range)  magnesium hydroxide (MILK OF MAGNESIA) suspension 30 mL (has no administration in time range)  ondansetron (ZOFRAN) tablet 4 mg (has no administration in time range)    Or  ondansetron (ZOFRAN) injection 4 mg (has no administration in time range)  traZODone (DESYREL) tablet 25 mg (has no administration in time range)  LORazepam (ATIVAN) injection 1 mg (has no administration in time range)  aspirin EC tablet 81 mg (81 mg Oral Given 05/01/22 0928)  amLODipine (NORVASC) tablet 10 mg (10 mg Oral Given 05/01/22 0928)  pravastatin (PRAVACHOL) tablet 5 mg (5 mg Oral Given 05/01/22 6301)  ferrous sulfate tablet 325 mg (325 mg Oral Given 05/01/22 0943)  levETIRAcetam (KEPPRA) tablet 500 mg (has no  administration in time range)    Mobility walks     Focused Assessments Neuro Assessment Handoff:  Swallow screen pass? Yes          Neuro Assessment: Within Defined Limits Neuro Checks:      Has TPA been given? No If patient is a Neuro Trauma and patient is going to OR before floor call report to [facility] nurse: [phone] or [phone]   R Recommendations: See Admitting Provider Note  Report given to:   Additional Notes:  seizure like activity. Pt had 1 witnessed sz. His wife was unable to provide much information d/t being hysterical per EMS. Pt has had 1 previous sz which was in 2017. Pt is not on any sz medications. Upon arrival to scene pt was altered and flailing around. Patient is aware of time, little confusion

## 2022-05-01 NOTE — Progress Notes (Signed)
PROGRESS NOTE    Matthew Cross  ZOX:[id number] DOB: 1955/04/18 DOA: 04/30/2022 PCP: [facility]   Brief Narrative:  67 year old male with history of hypertension, alcohol-related seizure disorder, PTSD, anemia presented with seizure-like activity.  On presentation, urine drug screen was positive for benzodiazepines; alcohol level was less than 10.  CT head without contrast showed no acute intracranial abnormity's.  MRI brain without contrast revealed age-related cerebral atrophy with chronic microvascular ischemic changes with no acute intracranial abnormity.  He was started on Keppra.  Neurology was consulted.  Assessment & Plan:   Breakthrough seizure -Presented with tonic-clonic seizure and postictal phase -Neurology following.  Currently on Keppra.  Seizure precautions.  Fall precautions.  PT/SLP evaluation. -Follow EEG -Has not been on any antiseizure medication since 2017.  Hyponatremia -Mild.  Continue IV fluids.  No labs today.  Repeat a.m. labs.  Hydrochlorothiazide on hold  History of unspecified CVA Hyperlipidemia -Continue aspirin and statin  Hypertension--monitor blood pressure.  Continue amlodipine.  Hydrochlorothiazide on hold   DVT prophylaxis: SCDs  code Status: Full Family Communication: Wife at bedside Disposition Plan: Status is: Observation The patient will require care spanning > 2 midnights and should be moved to inpatient because: Pending PT eval and final neurology recommendations.  EEG report pending  Consultants: Neurology  Procedures: EEG report pending  Antimicrobials: None   Subjective: Patient seen and examined at bedside.  Slow to respond, poor historian.  Denies any fever, headache or vomiting.  Wife at bedside.  No seizures since admission.  Objective: Vitals:   05/01/22 0230 05/01/22 0400 05/01/22 0600 05/01/22 0918  BP:  131/73 128/71 129/69  Pulse: (!) 58 63 (!) 58 64  Resp: 15 18 14 18   Temp:    98.7 F (37.1 C)   TempSrc:    Oral  SpO2: 99% 100% 100% 100%  Weight:      Height:        Intake/Output Summary (Last 24 hours) at 05/01/2022 0940 Last data filed at 04/30/2022 2341 Gross per 24 hour  Intake 1344.59 ml  Output --  Net 1344.59 ml   Filed Weights   04/30/22 1950  Weight: 77.1 kg    Examination:  General exam: Appears calm and comfortable.  No distress.  On room air. Respiratory system: Bilateral decreased breath sounds at bases, no wheezing Cardiovascular system: S1 & S2 heard, mild intermittent bradycardia present Gastrointestinal system: Abdomen is nondistended, soft and nontender. Normal bowel sounds heard. Extremities: No cyanosis, clubbing, edema  Central nervous system: Alert, slow to respond.  Answers some questions.  Poor historian.  No focal neurological deficits. Moving extremities Skin: No rashes, lesions or ulcers Psychiatry: Flat affect.  Not agitated.    Data Reviewed: I have personally reviewed following labs and imaging studies  CBC: Recent Labs  Lab 04/30/22 1620  WBC 3.3*  HGB 13.0  HCT 40.5  MCV 87.3  PLT 811   Basic Metabolic Panel: Recent Labs  Lab 04/30/22 1620  NA 131*  K 3.9  CL 94*  CO2 25  GLUCOSE 105*  BUN 7*  CREATININE 0.96  CALCIUM 8.9  MG 1.9   GFR: Estimated Creatinine Clearance: 72.2 mL/min (by C-G formula based on SCr of 0.96 mg/dL). Liver Function Tests: Recent Labs  Lab 04/30/22 1620  AST 24  ALT 18  ALKPHOS 41  BILITOT 0.8  PROT 6.7  ALBUMIN 4.1   No results for input(s): "LIPASE", "AMYLASE" in the last 168 hours. No results for input(s): "AMMONIA" in the  last 168 hours. Coagulation Profile: No results for input(s): "INR", "PROTIME" in the last 168 hours. Cardiac Enzymes: No results for input(s): "CKTOTAL", "CKMB", "CKMBINDEX", "TROPONINI" in the last 168 hours. BNP (last 3 results) No results for input(s): "PROBNP" in the last 8760 hours. HbA1C: No results for input(s): "HGBA1C" in the last 72  hours. CBG: Recent Labs  Lab 04/30/22 1610  GLUCAP 130*   Lipid Profile: No results for input(s): "CHOL", "HDL", "LDLCALC", "TRIG", "CHOLHDL", "LDLDIRECT" in the last 72 hours. Thyroid Function Tests: No results for input(s): "TSH", "T4TOTAL", "FREET4", "T3FREE", "THYROIDAB" in the last 72 hours. Anemia Panel: No results for input(s): "VITAMINB12", "FOLATE", "FERRITIN", "TIBC", "IRON", "RETICCTPCT" in the last 72 hours. Sepsis Labs: No results for input(s): "PROCALCITON", "LATICACIDVEN" in the last 168 hours.  No results found for this or any previous visit (from the past 240 hour(s)).       Radiology Studies: EEG adult  Result Date: 05-12-2022 Lora Havens, MD     May 12, 2022  9:06 AM Patient Name: Matthew Cross MRN: [id number] Epilepsy Attending: Lora Havens Referring Physician/Provider: Christel Mormon, MD Date: 04/30/2022 Duration: 21.14 mins Patient history: 67yo M with breakthrough seizure. EEG to evaluate for seizure. Level of alertness: Awake AEDs during EEG study: LEV Technical aspects: This EEG study was done with scalp electrodes positioned according to the 10-20 International system of electrode placement. Electrical activity was reviewed with band pass filter of 1-70Hz , sensitivity of 7 uV/mm, display speed of 44mm/sec with a 60Hz  notched filter applied as appropriate. EEG data were recorded continuously and digitally stored.  Video monitoring was available and reviewed as appropriate. Description: The posterior dominant rhythm consists of 9-10 Hz activity of moderate voltage (25-35 uV) seen predominantly in posterior head regions, symmetric and reactive to eye opening and eye closing. Physiologic photic driving was seen during photic stimulation.  Hyperventilation was not performed.   IMPRESSION: This study is within normal limits. No seizures or epileptiform discharges were seen throughout the recording. A normal interictal EEG does not exclude the diagnosis of epilepsy.  Lora Havens   MR BRAIN WO CONTRAST  Result Date: 04/30/2022 CLINICAL DATA:  Initial evaluation for mental status change. EXAM: MRI HEAD WITHOUT CONTRAST TECHNIQUE: Multiplanar, multiecho pulse sequences of the brain and surrounding structures were obtained without intravenous contrast. COMPARISON:  CT from earlier the same day. FINDINGS: Brain: Examination technically limited as the patient was unable to tolerate the full length of the exam. Additionally, provided images are severely degraded by motion artifact. Age-related cerebral atrophy with chronic microvascular ischemic disease. No evidence for acute or subacute ischemic infarct. Gray-white matter differentiation grossly maintained. No visible areas of chronic cortical infarction. No visible mass lesion, mass effect or midline shift. No hydrocephalus or extra-axial fluid collection. Vascular: Major intracranial vascular flow voids are grossly maintained at the skull base. Skull and upper cervical spine: Craniocervical junction grossly normal. Bone marrow signal intensity grossly within normal limits. No scalp soft tissue abnormality. Sinuses/Orbits: Globes orbital soft tissues grossly within normal limits. Right maxillary sinus retention cyst noted. No significant mastoid effusion. Other: None. IMPRESSION: 1. Technically limited exam due to the patient's inability to tolerate the full length of the study as well as severe motion artifact. 2. No definite acute intracranial abnormality. 3. Age-related cerebral atrophy with chronic microvascular ischemic disease. Electronically Signed   By: Jeannine Boga M.D.   On: 04/30/2022 23:09   CT Head Wo Contrast  Result Date: 04/30/2022 CLINICAL DATA:  Seizure EXAM: CT  HEAD WITHOUT CONTRAST TECHNIQUE: Contiguous axial images were obtained from the base of the skull through the vertex without intravenous contrast. RADIATION DOSE REDUCTION: This exam was performed according to the departmental  dose-optimization program which includes automated exposure control, adjustment of the mA and/or kV according to patient size and/or use of iterative reconstruction technique. COMPARISON:  CT 12/22/2016 FINDINGS: Brain: No acute territorial infarction, hemorrhage or intracranial mass. Patchy white matter hypodensity consistent with chronic small vessel ischemic change. Stable ventricle size Vascular: No hyperdense vessels. Vertebral and carotid vascular calcification. Skull: Normal. Negative for fracture or focal lesion. Sinuses/Orbits: No acute finding. Other: None IMPRESSION: 1. No CT evidence for acute intracranial abnormality. 2. Mild chronic small vessel ischemic changes of the white matter. Electronically Signed   By: Donavan Foil M.D.   On: 04/30/2022 17:45        Scheduled Meds:  amLODipine  10 mg Oral Daily   aspirin EC  81 mg Oral Daily   ferrous sulfate  325 mg Oral QODAY   levETIRAcetam  500 mg Oral BID   pravastatin  5 mg Oral Daily   Continuous Infusions:  sodium chloride Stopped (04/30/22 2341)   sodium chloride 75 mL/hr (05/01/22 0829)          Aline August, MD [facility] 05/01/2022, 9:40 AM
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
## 2022-05-01 NOTE — Plan of Care (Signed)
Neurology chart review note  EEG normal. Recommendations as in the consult note from this morning. Continue Keppra 500 twice daily. [facility] Seizure precautions Plan relayed to Dr. Starla Link  -- Amie Portland, MD Neurologist [facility] Pager: [phone]

## 2022-05-02 ENCOUNTER — Other Ambulatory Visit (HOSPITAL_COMMUNITY): Payer: Self-pay

## 2022-05-02 DIAGNOSIS — G40919 Epilepsy, unspecified, intractable, without status epilepticus: Secondary | ICD-10-CM | POA: Diagnosis not present

## 2022-05-02 LAB — MAGNESIUM: Magnesium: 1.9 mg/dL (ref 1.7–2.4)

## 2022-05-02 LAB — BASIC METABOLIC PANEL
Anion gap: 10 (ref 5–15)
BUN: 9 mg/dL (ref 8–23)
CO2: 25 mmol/L (ref 22–32)
Calcium: 8.9 mg/dL (ref 8.9–10.3)
Chloride: 99 mmol/L (ref 98–111)
Creatinine, Ser: 0.75 mg/dL (ref 0.61–1.24)
GFR, Estimated: >60 mL/min (ref >60)
Glucose, Bld: 114 mg/dL — ABNORMAL HIGH (ref 70–99)
Potassium: 3.3 mmol/L — ABNORMAL LOW (ref 3.5–5.1)
Sodium: 134 mmol/L — ABNORMAL LOW (ref 135–145)

## 2022-05-02 MED ORDER — LEVETIRACETAM 500 MG PO TABS: 500.0000 mg | ORAL_TABLET | Freq: Two times a day (BID) | ORAL | 0 refills | Status: DC

## 2022-05-02 MED ORDER — LEVETIRACETAM 500 MG PO TABS
500.0000 mg | ORAL_TABLET | Freq: Two times a day (BID) | ORAL | 0 refills | Status: DC
  Filled 2022-05-02: qty 4, 2d supply, fill #0

## 2022-05-02 MED ORDER — POTASSIUM CHLORIDE CRYS ER 20 MEQ PO TBCR
40.0000 meq | EXTENDED_RELEASE_TABLET | Freq: Once | ORAL | Status: AC
  Administered 2022-05-02: 40 meq via ORAL
  Filled 2022-05-02: qty 2

## 2022-05-02 NOTE — TOC Transition Note (Addendum)
Transition of Care (TOC) - CM/SW Discharge Note [name] RN,BSN Transitions of Care Unit 4NP (Non Trauma)- RN Case Manager See Treatment Team for direct Phone #   Patient Details  Name: Matthew Cross MRN: [id number] Date of Birth: 09-13-1955  [facility]) CM/SW Contact:  [name], RN Phone Number: 05/02/2022, 11:09 AM   Clinical Narrative:    Pt stable for transition home today w/ wife, Pt is followed by the [name]- primary care at the [facility]. Per wife [facility] arranges transportation, wife does not drive and daughter lives out of state.  [facility].  Meds to be delivered by [name] pharmacy   No further TOC needs noted,  CSW has provided taxi voucher for discharge transport needs.   1140- received call from bedside RN- [name] unable to fill meds as pt has [facility] benefits- TOC pharmacy as forwarded script to the [name] to fill - however per [name] pt will need PCP approval prior to them filling med. CM has reached out to the [facility] and msg left for Pharmacist to return call about pt's discharge med needs-   1300- received call back from [name]- pharmacist- [name]- the [facility] has filled the new script for Keppra and it is ready for pt to pick up at the [name]- asked about delivery options however if they ship it to home it will take 7-10 days- best option for pt is to find someone that can pick it up for him or for him to get the the [name] to pick up himself.   TOC can assist with this evenings dose and a dose for the am in order to give pt time to get to the [name] to pick up meds- CM spoke w/ wife regarding this arrangement and she voiced that she will work on trying to figure out a way to get to the [employer].   MD has sent new script for 4pils to the [name] (CM assisted with cost via petty cash) as pt and wife voiced they did not have the money to pay out of pocket. TOC pharmacy delivered  meds to pt/wife at the discharge lounge. Pt/wife have taxi voucher to get home.   Per the [place]- pt's PCP is [name]   Final next level of care: Home/Self Care Barriers to Discharge: No Barriers Identified   Patient Goals and CMS Choice   Choice offered to / list presented to : NA  Discharge Placement                 Home        Discharge Plan and Services Additional resources added to the After Visit Summary for                  DME Arranged: N/A DME Agency: NA       HH Arranged: NA HH Agency: NA        Social Determinants of Health (SDOH) Interventions SDOH Screenings   Food Insecurity: No Food Insecurity (05/01/2022)  Housing: Low Risk  (05/01/2022)  Transportation Needs: No Transportation Needs (05/02/2022)  Utilities: Not At Risk (05/01/2022)  Depression (PHQ2-9): Low Risk  (11/26/2018)  Tobacco Use: Low Risk  (04/30/2022)     Readmission Risk Interventions     No data to display

## 2022-05-02 NOTE — Evaluation (Signed)
Occupational Therapy Evaluation Patient Details Name: Matthew Cross MRN: [id number] DOB: 1956/01/24 Today's Date: 05/02/2022   History of Present Illness 67 y.o. male presents to [facility] hospital on 04/30/2022 after seizure. PMH includes anemia, gout, HTN, PTSD, seizures, cognitive impairment.   Clinical Impression   PTA patient independent with ADLs and mobility, managing meds and finances but not driving.  Spouse at side and very supportive, provides 24/7 support.  Patient admitted for above and presents with problem list below.  Known cognitive impairment at baseline, but family endorse worsening cognition since seizure.  He scored 21/28 on short blessed test, demonstrating significant deficits in time orientation, problem solving, attention and recall.  Pt also engaged in path finding task, requiring total assist to recall room number and up to mod assist to locate his room.  Educated pt and spouse on importance of pt having assist with all IADLs at this time, including medication, meals and finances. Based on performance today, believe pt will best benefit from further OT services acutely and after dc at outpatient OT level to address higher level cognition.  Will follow acutely.     Recommendations for follow up therapy are one component of a multi-disciplinary discharge planning process, led by the attending physician.  Recommendations may be updated based on patient status, additional functional criteria and insurance authorization.   Follow Up Recommendations  Outpatient OT (for higher level cognition)     Assistance Recommended at Discharge Frequent or constant Supervision/Assistance  Patient can return home with the following Assistance with cooking/housework;Direct supervision/assist for medications management;Direct supervision/assist for financial management;Assist for transportation    Functional Status Assessment  Patient has had a recent decline in their functional status and  demonstrates the ability to make significant improvements in function in a reasonable and predictable amount of time.  Equipment Recommendations  None recommended by OT    Recommendations for Other Services Speech consult     Precautions / Restrictions Precautions Precautions: Other (comment) Precaution Comments: seizure Restrictions Weight Bearing Restrictions: No      Mobility Bed Mobility Overal bed mobility: Independent                  Transfers Overall transfer level: Independent                        Balance Overall balance assessment: Mild deficits observed, not formally tested                                         ADL either performed or assessed with clinical judgement   ADL Overall ADL's : Independent                                             Vision   Vision Assessment?: No apparent visual deficits     Perception     Praxis      Pertinent Vitals/Pain Pain Assessment Pain Assessment: No/denies pain     Hand Dominance Right   Extremity/Trunk Assessment Upper Extremity Assessment Upper Extremity Assessment: Overall WFL for tasks assessed   Lower Extremity Assessment Lower Extremity Assessment: Overall WFL for tasks assessed   Cervical / Trunk Assessment Cervical / Trunk Assessment: Normal   Communication Communication Communication: No difficulties   Cognition Arousal/Alertness:  Awake/alert Behavior During Therapy: Impulsive Overall Cognitive Status: History of cognitive impairments - at baseline                                 General Comments: pt with hx of cognitive deficits, but per PT note pt with impairments more than baseline after speaking to daughter and spouse agrees.  Patient able to engage and follow simple commands iwth increased time.  He is only oriented to self and month.  REports 2009.  Short blessed test reveals significant deficits in time, attention,  memory, and sequencing, pt scoring 21/28.     General Comments  discussed safety with IADLs with spouse- educated spouse on needing to assist pt with medications at this time.  She voicies "oh he can do that, he has always done it".  Further education on cognitive changes and needing assist currently since seizure, spouse voiced understanding.    Exercises     Shoulder Instructions      Home Living Family/patient expects to be discharged to:: Private residence Living Arrangements: Spouse/significant other Available Help at Discharge: Family;Available 24 hours/day Type of Home: Apartment Home Access: Level entry     Home Layout: One level     Bathroom Shower/Tub: Teacher, early years/pre: Standard     Home Equipment: Cane - single point          Prior Functioning/Environment Prior Level of Function : Independent/Modified Independent             Mobility Comments: PRN use of cane due to knee pain ADLs Comments: pt independent ADLs, manages meds and finances but does not drive        OT Problem List: Decreased cognition;Decreased safety awareness      OT Treatment/Interventions: Self-care/ADL training;Therapeutic activities;Cognitive remediation/compensation;Patient/family education    OT Goals(Current goals can be found in the care plan section) Acute Rehab OT Goals Patient Stated Goal: home OT Goal Formulation: With patient Time For Goal Achievement: 05/16/22 Potential to Achieve Goals: Good  OT Frequency: Min 3X/week    Co-evaluation              AM-PAC OT "6 Clicks" Daily Activity     Outcome Measure Help from another person eating meals?: None Help from another person taking care of personal grooming?: None Help from another person toileting, which includes using toliet, bedpan, or urinal?: None Help from another person bathing (including washing, rinsing, drying)?: None Help from another person to put on and taking off regular upper  body clothing?: None Help from another person to put on and taking off regular lower body clothing?: None 6 Click Score: 24   End of Session Nurse Communication: Mobility status  Activity Tolerance: Patient tolerated treatment well Patient left: with call bell/phone within reach;Other (comment);with family/visitor present (sitting EOB)  OT Visit Diagnosis: Other symptoms and signs involving cognitive function                Time: 0998-3382 OT Time Calculation (min): 24 min Charges:  OT General Charges $OT Visit: 1 Visit OT Evaluation $OT Eval Moderate Complexity: 1 Mod  Jolaine Artist, OT Acute Rehabilitation Services Office [phone]   Delight Stare 05/02/2022, 11:51 AM

## 2022-05-02 NOTE — Progress Notes (Signed)
Discharge instructions provided to patient and wife. All medications, follow up appointments, and discharge instructions provided. IV out. Monitor off CCMD notified. Discharging to home with wife.

## 2022-05-02 NOTE — TOC Progression Note (Signed)
Transition of Care [facility]) - Progression Note    Patient Details  Name: Matthew Cross MRN: [id number] Date of Birth: 05-29-1955  Transition of Care [facility]) CM/SW [place], [place] Phone Number: 05/02/2022, 10:55 AM  Clinical Narrative:            Expected Discharge Plan and Services         Expected Discharge Date: 05/02/22                                     Social Determinants of Health (SDOH) Interventions SDOH Screenings   Food Insecurity: No Food Insecurity (05/01/2022)  Housing: Low Risk  (05/01/2022)  Transportation Needs: No Transportation Needs (05/02/2022)  Utilities: Not At Risk (05/01/2022)  Depression (PHQ2-9): Low Risk  (11/26/2018)  Tobacco Use: Low Risk  (04/30/2022)    Readmission Risk Interventions     No data to display

## 2022-05-02 NOTE — Social Work (Signed)
CSW received message from [facility]- patient needs taxi voucher/transportation home.   CSW spoke with the patient's spouse- she confirmed, patient needs ride home, bus stop was too far for patient to walk home. She states  would like to pay for transportation but is unable.   CSW provided [employer]  Sales executive signed and placed in chart.  Thurmond Butts, MSW, LCSW Clinical Social Worker

## 2022-05-02 NOTE — Discharge Summary (Signed)
Physician Discharge Summary  Matthew Cross NTI:[id number] DOB: 03-Jan-1956 DOA: 04/30/2022  PCP: [facility], [facility]  Admit date: 04/30/2022 Discharge date: 05/02/2022  Admitted From: Home Disposition: Home  Recommendations for Outpatient Follow-up:  Follow up with PCP in 1 week with repeat  BMP Outpatient follow-up with neurology at [facility] Follow up in ED if symptoms worsen or new appear   Home Health: No Equipment/Devices: None  Discharge Condition: Stable CODE STATUS: Full Diet recommendation: Heart healthy  Brief/Interim Summary: 67 year old male with history of hypertension, alcohol-related seizure disorder, PTSD, anemia presented with seizure-like activity. On presentation, urine drug screen was positive for benzodiazepines; alcohol level was less than 10. CT head without contrast showed no acute intracranial abnormity's. MRI brain without contrast revealed age-related cerebral atrophy with chronic microvascular ischemic changes with no acute intracranial abnormity. He was started on Keppra. Neurology was consulted.  EEG was negative.  Subsequently he has had no seizures.  Neurology cleared the patient for discharge on oral Keppra.  He will be discharged home today.  Outpatient follow-up with PCP and neurology.  Discharge Diagnoses:   Breakthrough seizure -Presented with tonic-clonic seizure and postictal phase -Currently on Keppra.  Seizure precautions.  Fall precautions.  Tolerating diet tolerated PT.  No PT follow-up recommended. -EEG negative for seizures -Neurology evaluated the patient during the hospitalization. Neurology cleared the patient for discharge on oral Keppra.  He will be discharged home today.  Outpatient follow-up with PCP and neurology.  He should not be driving for at least 6 months till cleared by PCP/neurology   Hyponatremia -Mild.  Treated with IV fluids.  Improving.  Outpatient follow-up.    History of unspecified CVA Hyperlipidemia -Continue aspirin and  statin   Hypertension--Continue amlodipine.  Hydrochlorothiazide on hold till reevaluation by PCP  Hypokalemia -Replace prior to discharge  Discharge Instructions   Allergies as of 05/02/2022       Reactions   Shellfish Allergy Other (See Comments)   Causes gout flares   Zoloft [sertraline] Other (See Comments)   Makes patient "feel like a zombie"/INDUCED A SEIZURE!!   Atorvastatin    Unknown reaction per [facility]   Grapefruit Extract Swelling   Grapefruit Bioflavonoid Complex Rash   Lisinopril Cough        Medication List     STOP taking these medications    hydrochlorothiazide 25 MG tablet Commonly known as: HYDRODIURIL       TAKE these medications    amLODipine 10 MG tablet Commonly known as: NORVASC Take 10 mg by mouth daily.   aspirin EC 81 MG tablet Take 1 tablet (81 mg total) by mouth daily.   ferrous sulfate 325 (65 FE) MG tablet Take 325 mg by mouth every other day.   levETIRAcetam 500 MG tablet Commonly known as: KEPPRA Take 1 tablet (500 mg total) by mouth 2 (two) times daily.   pravastatin 10 MG tablet Commonly known as: PRAVACHOL Take 5 mg by mouth daily.        Follow-up Information     Cl[facility], [place]. Schedule an appointment as soon as possible for a visit in 1 week(s).   Why: with repeat bmp Contact information: [place] [address] [phone]         [facility]. Schedule an appointment as soon as possible for a visit in 1 week(s).                 Allergies  Allergen Reactions   Shellfish Allergy Other (See Comments)  Causes gout flares     Zoloft [Sertraline] Other (See Comments)    Makes patient "feel like a zombie"/INDUCED A SEIZURE!!   Atorvastatin     Unknown reaction per [facility]   Grapefruit Extract Swelling   Grapefruit Bioflavonoid Complex Rash   Lisinopril Cough    Consultations: Neurology   Procedures/Studies: EEG adult  Result Date:  05/01/2022 Lora Havens, MD     05/01/2022  9:06 AM Patient Name: Matthew Cross MRN: [id number] Epilepsy Attending: Lora Havens Referring Physician/Provider: Christel Mormon, MD Date: 04/30/2022 Duration: 21.14 mins Patient history: 67yo M with breakthrough seizure. EEG to evaluate for seizure. Level of alertness: Awake AEDs during EEG study: LEV Technical aspects: This EEG study was done with scalp electrodes positioned according to the 10-20 International system of electrode placement. Electrical activity was reviewed with band pass filter of 1-70Hz , sensitivity of 7 uV/mm, display speed of 78mm/sec with a 60Hz  notched filter applied as appropriate. EEG data were recorded continuously and digitally stored.  Video monitoring was available and reviewed as appropriate. Description: The posterior dominant rhythm consists of 9-10 Hz activity of moderate voltage (25-35 uV) seen predominantly in posterior head regions, symmetric and reactive to eye opening and eye closing. Physiologic photic driving was seen during photic stimulation.  Hyperventilation was not performed.   IMPRESSION: This study is within normal limits. No seizures or epileptiform discharges were seen throughout the recording. A normal interictal EEG does not exclude the diagnosis of epilepsy. Lora Havens   MR BRAIN WO CONTRAST  Result Date: 04/30/2022 CLINICAL DATA:  Initial evaluation for mental status change. EXAM: MRI HEAD WITHOUT CONTRAST TECHNIQUE: Multiplanar, multiecho pulse sequences of the brain and surrounding structures were obtained without intravenous contrast. COMPARISON:  CT from earlier the same day. FINDINGS: Brain: Examination technically limited as the patient was unable to tolerate the full length of the exam. Additionally, provided images are severely degraded by motion artifact. Age-related cerebral atrophy with chronic microvascular ischemic disease. No evidence for acute or subacute ischemic infarct. Gray-white matter  differentiation grossly maintained. No visible areas of chronic cortical infarction. No visible mass lesion, mass effect or midline shift. No hydrocephalus or extra-axial fluid collection. Vascular: Major intracranial vascular flow voids are grossly maintained at the skull base. Skull and upper cervical spine: Craniocervical junction grossly normal. Bone marrow signal intensity grossly within normal limits. No scalp soft tissue abnormality. Sinuses/Orbits: Globes orbital soft tissues grossly within normal limits. Right maxillary sinus retention cyst noted. No significant mastoid effusion. Other: None. IMPRESSION: 1. Technically limited exam due to the patient's inability to tolerate the full length of the study as well as severe motion artifact. 2. No definite acute intracranial abnormality. 3. Age-related cerebral atrophy with chronic microvascular ischemic disease. Electronically Signed   By: Jeannine Boga M.D.   On: 04/30/2022 23:09   CT Head Wo Contrast  Result Date: 04/30/2022 CLINICAL DATA:  Seizure EXAM: CT HEAD WITHOUT CONTRAST TECHNIQUE: Contiguous axial images were obtained from the base of the skull through the vertex without intravenous contrast. RADIATION DOSE REDUCTION: This exam was performed according to the departmental dose-optimization program which includes automated exposure control, adjustment of the mA and/or kV according to patient size and/or use of iterative reconstruction technique. COMPARISON:  CT 12/22/2016 FINDINGS: Brain: No acute territorial infarction, hemorrhage or intracranial mass. Patchy white matter hypodensity consistent with chronic small vessel ischemic change. Stable ventricle size Vascular: No hyperdense vessels. Vertebral and carotid vascular calcification. Skull: Normal. Negative for fracture or  focal lesion. Sinuses/Orbits: No acute finding. Other: None IMPRESSION: 1. No CT evidence for acute intracranial abnormality. 2. Mild chronic small vessel ischemic  changes of the white matter. Electronically Signed   By: Jasmine Pang M.D.   On: 04/30/2022 17:45      Subjective: Patient seen and examined at bedside.  Feels better and wants to go home.  No seizures since admission.  No fever or vomiting reported.  Discharge Exam: Vitals:   05/02/22 0600 05/02/22 0808  BP:  132/70  Pulse: (!) 53 60  Resp: 17 14  Temp:  98.1 F (36.7 C)  SpO2: 99% 94%    General: Pt is alert, awake, not in acute distress.  On room air.  Slow to respond.  Poor historian. Cardiovascular: rate controlled, S1/S2 + Respiratory: bilateral decreased breath sounds at bases Abdominal: Soft, NT, ND, bowel sounds + Extremities: no edema, no cyanosis    The results of significant diagnostics from this hospitalization (including imaging, microbiology, ancillary and laboratory) are listed below for reference.     Microbiology: Recent Results (from the past 240 hour(s))  MRSA Next Gen by PCR, Nasal     Status: None   Collection Time: 05/01/22 10:57 AM   Specimen: Nasal Mucosa; Nasal Swab  Result Value Ref Range Status   MRSA by PCR Next Gen NOT DETECTED NOT DETECTED Final    Comment: (NOTE) The GeneXpert MRSA Assay (FDA approved for NASAL specimens only), is one component of a comprehensive MRSA colonization surveillance program. It is not intended to diagnose MRSA infection nor to guide or monitor treatment for MRSA infections. Test performance is not FDA approved in patients less than 66 years old. Performed at [facility], [address], [place], [place] [address]      Labs: BNP (last 3 results) No results for input(s): "BNP" in the last 8760 hours. Basic Metabolic Panel: Recent Labs  Lab 04/30/22 1620 05/02/22 0300  NA 131* 134*  K 3.9 3.3*  CL 94* 99  CO2 25 25  GLUCOSE 105* 114*  BUN 7* 9  CREATININE 0.96 0.75  CALCIUM 8.9 8.9  MG 1.9 1.9   Liver Function Tests: Recent Labs  Lab 04/30/22 1620  AST 24  ALT 18  ALKPHOS 41   BILITOT 0.8  PROT 6.7  ALBUMIN 4.1   No results for input(s): "LIPASE", "AMYLASE" in the last 168 hours. No results for input(s): "AMMONIA" in the last 168 hours. CBC: Recent Labs  Lab 04/30/22 1620  WBC 3.3*  HGB 13.0  HCT 40.5  MCV 87.3  PLT 214   Cardiac Enzymes: No results for input(s): "CKTOTAL", "CKMB", "CKMBINDEX", "TROPONINI" in the last 168 hours. BNP: Invalid input(s): "POCBNP" CBG: Recent Labs  Lab 04/30/22 1610  GLUCAP 130*   D-Dimer No results for input(s): "DDIMER" in the last 72 hours. Hgb A1c No results for input(s): "HGBA1C" in the last 72 hours. Lipid Profile No results for input(s): "CHOL", "HDL", "LDLCALC", "TRIG", "CHOLHDL", "LDLDIRECT" in the last 72 hours. Thyroid function studies No results for input(s): "TSH", "T4TOTAL", "T3FREE", "THYROIDAB" in the last 72 hours.  Invalid input(s): "FREET3" Anemia work up No results for input(s): "VITAMINB12", "FOLATE", "FERRITIN", "TIBC", "IRON", "RETICCTPCT" in the last 72 hours. Urinalysis    Component Value Date/Time   COLORURINE YELLOW 12/22/2016 1859   APPEARANCEUR CLOUDY (A) 12/22/2016 1859   LABSPEC 1.019 12/22/2016 1859   PHURINE 5.0 12/22/2016 1859   GLUCOSEU NEGATIVE 12/22/2016 1859   HGBUR SMALL (A) 12/22/2016 1859  BILIRUBINUR NEGATIVE 12/22/2016 1859   KETONESUR 5 (A) 12/22/2016 1859   PROTEINUR 100 (A) 12/22/2016 1859   NITRITE NEGATIVE 12/22/2016 1859   LEUKOCYTESUR NEGATIVE 12/22/2016 1859   Sepsis Labs Recent Labs  Lab 04/30/22 1620  WBC 3.3*   Microbiology Recent Results (from the past 240 hour(s))  MRSA Next Gen by PCR, Nasal     Status: None   Collection Time: 05/01/22 10:57 AM   Specimen: Nasal Mucosa; Nasal Swab  Result Value Ref Range Status   MRSA by PCR Next Gen NOT DETECTED NOT DETECTED Final    Comment: (NOTE) The GeneXpert MRSA Assay (FDA approved for NASAL specimens only), is one component of a comprehensive MRSA colonization surveillance program. It is not  intended to diagnose MRSA infection nor to guide or monitor treatment for MRSA infections. Test performance is not FDA approved in patients less than 99 years old. Performed at [facility], [place] [address], [place], [place] [address]      Time coordinating discharge: 35 minutes  SIGNED:   Aline August, MD  [facility] 05/02/2022, 8:22 AM

## 2022-05-02 NOTE — Progress Notes (Signed)
Mobility Specialist Progress Note    05/02/22 1057  Mobility  Activity Ambulated independently in hallway  Level of Assistance Standby assist, set-up cues, supervision of patient - no hands on  Assistive Device None  Distance Ambulated (ft) 400 ft  Activity Response Tolerated well  Mobility Referral Yes  $Mobility charge 1 Mobility   Pt received in room and agreeable. No complaints on walk. Returned to room with RN present.  Hildred Alamin Mobility Specialist  Please Psychologist, sport and exercise or Rehab Office at [phone]

## 2022-07-03 ENCOUNTER — Other Ambulatory Visit: Payer: Self-pay

## 2022-07-03 ENCOUNTER — Other Ambulatory Visit (HOSPITAL_COMMUNITY): Payer: Self-pay

## 2022-07-03 ENCOUNTER — Emergency Department (HOSPITAL_COMMUNITY)
Admission: EM | Admit: 2022-07-03 | Discharge: 2022-07-03 | Disposition: A | Discharge status: Hospice | Payer: No Typology Code available for payment source | Attending: Emergency Medicine | Admitting: Emergency Medicine

## 2022-07-03 DIAGNOSIS — Z7982 Long term (current) use of aspirin: Secondary | ICD-10-CM | POA: Insufficient documentation

## 2022-07-03 DIAGNOSIS — R569 Unspecified convulsions: Secondary | ICD-10-CM | POA: Diagnosis present

## 2022-07-03 DIAGNOSIS — Z79899 Other long term (current) drug therapy: Secondary | ICD-10-CM | POA: Insufficient documentation

## 2022-07-03 DIAGNOSIS — I10 Essential (primary) hypertension: Secondary | ICD-10-CM | POA: Diagnosis not present

## 2022-07-03 LAB — CBC WITH DIFFERENTIAL/PLATELET
Abs Immature Granulocytes: 0.01 10*3/uL (ref 0.00–0.07)
Basophils Absolute: 0.1 10*3/uL (ref 0.0–0.1)
Basophils Relative: 2 %
Eosinophils Absolute: 0.2 10*3/uL (ref 0.0–0.5)
Eosinophils Relative: 5 %
HCT: 40.4 % (ref 39.0–52.0)
Hemoglobin: 13.1 g/dL (ref 13.0–17.0)
Immature Granulocytes: 0 %
Lymphocytes Relative: 25 %
Lymphs Abs: 1 10*3/uL (ref 0.7–4.0)
MCH: 28.9 pg (ref 26.0–34.0)
MCHC: 32.4 g/dL (ref 30.0–36.0)
MCV: 89 fL (ref 80.0–100.0)
Monocytes Absolute: 0.4 10*3/uL (ref 0.1–1.0)
Monocytes Relative: 12 %
Neutro Abs: 2.1 10*3/uL (ref 1.7–7.7)
Neutrophils Relative %: 56 %
Platelets: 227 10*3/uL (ref 150–400)
RBC: 4.54 MIL/uL (ref 4.22–5.81)
RDW: 12.4 % (ref 11.5–15.5)
WBC: 3.8 10*3/uL — ABNORMAL LOW (ref 4.0–10.5)
nRBC: 0 % (ref 0.0–0.2)

## 2022-07-03 LAB — COMPREHENSIVE METABOLIC PANEL
ALT: 19 U/L (ref 0–44)
AST: 20 U/L (ref 15–41)
Albumin: 4.2 g/dL (ref 3.5–5.0)
Alkaline Phosphatase: 40 U/L (ref 38–126)
Anion gap: 11 (ref 5–15)
BUN: 11 mg/dL (ref 8–23)
CO2: 23 mmol/L (ref 22–32)
Calcium: 9.2 mg/dL (ref 8.9–10.3)
Chloride: 97 mmol/L — ABNORMAL LOW (ref 98–111)
Creatinine, Ser: 0.93 mg/dL (ref 0.61–1.24)
GFR, Estimated: >60 mL/min (ref >60)
Glucose, Bld: 135 mg/dL — ABNORMAL HIGH (ref 70–99)
Potassium: 4 mmol/L (ref 3.5–5.1)
Sodium: 131 mmol/L — ABNORMAL LOW (ref 135–145)
Total Bilirubin: 1.1 mg/dL (ref 0.3–1.2)
Total Protein: 7.3 g/dL (ref 6.5–8.1)

## 2022-07-03 MED ORDER — LEVETIRACETAM 500 MG PO TABS
500.0000 mg | ORAL_TABLET | Freq: Two times a day (BID) | ORAL | 0 refills | Status: DC
  Filled 2022-07-03: qty 60, 30d supply, fill #0

## 2022-07-03 MED ORDER — LEVETIRACETAM IN NACL 1500 MG/100ML IV SOLN
1500.0000 mg | Freq: Once | INTRAVENOUS | Status: AC
  Administered 2022-07-03: 1500 mg via INTRAVENOUS
  Filled 2022-07-03: qty 100

## 2022-07-03 MED ORDER — LEVETIRACETAM 500 MG PO TABS
500.0000 mg | ORAL_TABLET | Freq: Two times a day (BID) | ORAL | 0 refills | Status: AC
  Filled 2022-07-03 (×2): qty 60, 30d supply, fill #0

## 2022-07-03 NOTE — ED Provider Notes (Signed)
[name] AT [facility] Provider Note   CSN: [id number] Arrival date & time: 07/03/22  6967     History {Add pertinent medical, surgical, social history, OB history to HPI:1} Chief Complaint  Patient presents with   Seizures    Matthew Cross is a 67 y.o. male.  HPI     67 year old male with a history of hypertension, alcohol related seizure disorder, PTSD, anemia, admission in February for seizure-like activity, who presents with concern for seizure.   After his admission in February, he was placed on Keppra.  A few days ago he was put on lamotrigine.  Sleeping, heard him making sonorous respirations nad then started shaking all over.  Lasted longer than last time.  Every time after he gets confused, wants to get out of bed and go to the bathroom.  Was similar to prior but longer, shaking all over.  Doesn't think bit tongue. No incontinence  Neurologist at [facility] last Friday, stopped keppra,, 4/6 began new ones.   Past Medical History:  Diagnosis Date   Anemia    Hard of hearing    History of gout    Hypertension    PTSD (post-traumatic stress disorder)    Seizures (HCC) 2014; 11/29/2015   "don't know why" (11/29/2015)     Home Medications Prior to Admission medications   Medication Sig Start Date End Date Taking? Authorizing Provider  amLODipine (NORVASC) 10 MG tablet Take 10 mg by mouth daily.    [provider]  aspirin EC 81 MG tablet Take 1 tablet (81 mg total) by mouth daily. 11/30/15   Leroy Sea, MD  ferrous sulfate 325 (65 FE) MG tablet Take 325 mg by mouth every other day.    [provider]  levETIRAcetam (KEPPRA) 500 MG tablet Take 1 tablet (500 mg total) by mouth 2 (two) times daily. 05/02/22   Glade Lloyd, MD  pravastatin (PRAVACHOL) 10 MG tablet Take 5 mg by mouth daily. 10/11/21   [provider]      Allergies    Shellfish allergy, Zoloft [sertraline], Atorvastatin, Grapefruit extract,  Grapefruit bioflavonoid complex, and Lisinopril    Review of Systems   Review of Systems  Physical Exam Updated Vital Signs Pulse 62   Temp 97.7 F (36.5 C)   Resp (!) 21   Ht 5\' 7"  (1.702 m)   Wt 73.5 kg   SpO2 100%   BMI 25.37 kg/m  Physical Exam  ED Results / Procedures / Treatments   Labs (all labs ordered are listed, but only abnormal results are displayed) Labs Reviewed - No data to display  EKG None  Radiology No results found.  Procedures Procedures  {Document cardiac monitor, telemetry assessment procedure when appropriate:1}  Medications Ordered in ED Medications - No data to display  ED Course/ Medical Decision Making/ A&P   {   Click here for ABCD2, HEART and other calculatorsREFRESH Note before signing :1}                          Medical Decision Making Amount and/or Complexity of Data Reviewed Labs: ordered.  Risk Prescription drug management.   ***  {Document critical care time when appropriate:1} {Document review of labs and clinical decision tools ie heart score, Chads2Vasc2 etc:1}  {Document your independent review of radiology images, and any outside records:1} {Document your discussion with family members, caretakers, and with consultants:1} {Document social determinants of health affecting pt's care:1} {  Document your decision making why or why not admission, treatments were needed:1} Final Clinical Impression(s) / ED Diagnoses Final diagnoses:  None    Rx / DC Orders ED Discharge Orders     None

## 2022-07-03 NOTE — ED Triage Notes (Signed)
Matthew Cross is a 67 y.o. male arrived via ems for seizure today pt was diagnosed 2014 pt was etoh abuser nut now sober pt was put on KEPPRA after a visit here and was taken off a few days ago and put on lamotrodrine pt now a alert and oriented x 3 pt unable to recall seizure per wife this seizure today lasted longer that "last time"

## 2022-07-04 LAB — LAMOTRIGINE LEVEL: Lamotrigine Lvl: <1 ug/mL — ABNORMAL LOW (ref 2.0–20.0)

## 2022-07-22 ENCOUNTER — Other Ambulatory Visit (HOSPITAL_COMMUNITY): Payer: Self-pay

## 2022-07-26 ENCOUNTER — Other Ambulatory Visit (HOSPITAL_COMMUNITY): Payer: Self-pay | Admitting: Emergency Medicine

## 2022-07-30 ENCOUNTER — Other Ambulatory Visit (HOSPITAL_COMMUNITY): Payer: Self-pay

## 2022-07-30 ENCOUNTER — Other Ambulatory Visit (HOSPITAL_COMMUNITY): Payer: Self-pay | Admitting: Emergency Medicine

## 2022-08-01 ENCOUNTER — Other Ambulatory Visit (HOSPITAL_COMMUNITY): Payer: Self-pay

## 2022-08-05 ENCOUNTER — Other Ambulatory Visit (HOSPITAL_COMMUNITY): Payer: Self-pay

## 2022-08-05 ENCOUNTER — Ambulatory Visit: Payer: Self-pay

## 2022-08-05 NOTE — Telephone Encounter (Signed)
Summary: Medication Advice   Pt wife is calling to ask advice Rx #: [id number] levETIRAcetam (KEPPRA) 500 MG tablet [[id number]]  ENDED . Pt does not remember taking his medication. Would like to ask questions regarding the dosage. Please advise     Reason for Disposition  [1] Caller has medicine question about med NOT prescribed by PCP AND [2] triager unable to answer question (e.g., compatibility with other med, storage)  Protocols used: Medication Question Call-A-AH   Chief Complaint: Pt and pt's wife are unsure if pt took morning dose Symptoms: none Frequency:  Pertinent Negatives: Patient denies  Disposition: [] ED /[] Urgent Care (no appt availability in office) / [] Appointment(In office/virtual)/ []  [place]/ [] Home Care/ [] Refused Recommended Disposition /[] [facility]/ []  Follow-up with PCP Additional Notes: Pt and wife are unsure if pt took morning dose. Advised wife to give next dose as scheduled. Wife is not comfortable with advice.Medication prescribed by Alvira Monday. PT's wife will call pharmacy.

## 2022-08-07 ENCOUNTER — Other Ambulatory Visit (HOSPITAL_COMMUNITY): Payer: Self-pay | Admitting: Internal Medicine

## 2022-08-07 DIAGNOSIS — Z8673 Personal history of transient ischemic attack (TIA), and cerebral infarction without residual deficits: Secondary | ICD-10-CM

## 2022-08-19 ENCOUNTER — Ambulatory Visit (HOSPITAL_COMMUNITY): Payer: No Typology Code available for payment source

## 2022-08-20 ENCOUNTER — Ambulatory Visit (HOSPITAL_COMMUNITY)
Admission: RE | Admit: 2022-08-20 | Discharge: 2022-08-20 | Disposition: A | Discharge status: Home / Self Care | Payer: No Typology Code available for payment source | Source: Ambulatory Visit | Attending: Internal Medicine | Admitting: Internal Medicine

## 2022-08-20 DIAGNOSIS — Z8673 Personal history of transient ischemic attack (TIA), and cerebral infarction without residual deficits: Secondary | ICD-10-CM | POA: Insufficient documentation

## 2022-08-20 NOTE — Progress Notes (Signed)
Bilateral carotid ultrasound study completed.   Please see CV Procedures for preliminary results.  Emorie Mcfate, RVT  9:19 AM 08/20/22

## 2022-10-16 ENCOUNTER — Other Ambulatory Visit (HOSPITAL_COMMUNITY): Payer: Self-pay

## 2023-04-08 ENCOUNTER — Emergency Department (HOSPITAL_COMMUNITY)
Admission: EM | Admit: 2023-04-08 | Discharge: 2023-04-09 | Discharge status: Against Medical Advice | Payer: No Typology Code available for payment source | Attending: Emergency Medicine | Admitting: Emergency Medicine

## 2023-04-08 ENCOUNTER — Other Ambulatory Visit: Payer: Self-pay

## 2023-04-08 ENCOUNTER — Encounter (HOSPITAL_COMMUNITY): Payer: Self-pay

## 2023-04-08 DIAGNOSIS — F0390 Unspecified dementia without behavioral disturbance: Secondary | ICD-10-CM | POA: Diagnosis not present

## 2023-04-08 DIAGNOSIS — Z5321 Procedure and treatment not carried out due to patient leaving prior to being seen by health care provider: Secondary | ICD-10-CM | POA: Insufficient documentation

## 2023-04-08 DIAGNOSIS — R197 Diarrhea, unspecified: Secondary | ICD-10-CM | POA: Insufficient documentation

## 2023-04-08 HISTORY — DX: Unspecified dementia, unspecified severity, without behavioral disturbance, psychotic disturbance, mood disturbance, and anxiety: F03.90

## 2023-04-08 NOTE — ED Notes (Addendum)
 Patient's wife stated they spoke with patients PCP and they decided to discontinue a medication that could be causing the side effects. They chose to leave and not be seen further here. Pulled OTF.

## 2023-04-08 NOTE — ED Triage Notes (Signed)
 Pt c/o diarrhea x 3 months, worsening; denies fevers, denies N/V, denies urinary issues; wife at bedside who is medical POA, pt has dementia  Verbal consent given for MSE

## 2023-12-24 ENCOUNTER — Institutional Professional Consult (permissible substitution) (INDEPENDENT_AMBULATORY_CARE_PROVIDER_SITE_OTHER)

## 2024-01-07 ENCOUNTER — Institutional Professional Consult (permissible substitution) (INDEPENDENT_AMBULATORY_CARE_PROVIDER_SITE_OTHER)
